# Patient Record
Sex: Female | Born: 1985 | Race: Black or African American | Hispanic: No | Marital: Single | Smoking: Never smoker
Health system: Southern US, Community
[De-identification: ages and names within clinical notes are randomized; demographics above are authoritative.]

## PROBLEM LIST (undated history)

## (undated) DIAGNOSIS — Z789 Other specified health status: Secondary | ICD-10-CM

## (undated) HISTORY — PX: NO PAST SURGERIES: SHX2092

---
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
## 2007-07-14 ENCOUNTER — Inpatient Hospital Stay (HOSPITAL_COMMUNITY): Admission: AD | Admit: 2007-07-14 | Discharge: 2007-07-15 | Payer: Self-pay | Admitting: Family Medicine

## 2007-07-20 ENCOUNTER — Inpatient Hospital Stay (HOSPITAL_COMMUNITY): Admission: AD | Admit: 2007-07-20 | Discharge: 2007-07-20 | Payer: Self-pay | Admitting: Obstetrics and Gynecology

## 2007-09-04 ENCOUNTER — Ambulatory Visit (HOSPITAL_COMMUNITY): Admission: RE | Admit: 2007-09-04 | Discharge: 2007-09-04 | Payer: Self-pay | Admitting: Family Medicine

## 2007-09-13 ENCOUNTER — Inpatient Hospital Stay (HOSPITAL_COMMUNITY): Admission: AD | Admit: 2007-09-13 | Discharge: 2007-09-13 | Payer: Self-pay | Admitting: Obstetrics & Gynecology

## 2007-09-26 ENCOUNTER — Ambulatory Visit: Payer: Self-pay

## 2007-09-26 ENCOUNTER — Ambulatory Visit: Payer: Self-pay | Admitting: Cardiology

## 2007-09-26 ENCOUNTER — Ambulatory Visit (HOSPITAL_COMMUNITY): Admission: RE | Admit: 2007-09-26 | Discharge: 2007-09-26 | Payer: Self-pay | Admitting: Family Medicine

## 2007-10-04 ENCOUNTER — Ambulatory Visit (HOSPITAL_COMMUNITY): Admission: RE | Admit: 2007-10-04 | Discharge: 2007-10-04 | Payer: Self-pay | Admitting: Family Medicine

## 2007-10-16 ENCOUNTER — Ambulatory Visit: Payer: Self-pay | Admitting: Obstetrics & Gynecology

## 2007-10-16 ENCOUNTER — Inpatient Hospital Stay (HOSPITAL_COMMUNITY): Admission: AD | Admit: 2007-10-16 | Discharge: 2007-10-16 | Payer: Self-pay | Admitting: Gynecology

## 2007-11-20 ENCOUNTER — Ambulatory Visit (HOSPITAL_COMMUNITY): Admission: RE | Admit: 2007-11-20 | Discharge: 2007-11-20 | Payer: Self-pay | Admitting: Obstetrics & Gynecology

## 2008-02-13 ENCOUNTER — Inpatient Hospital Stay (HOSPITAL_COMMUNITY): Admission: AD | Admit: 2008-02-13 | Discharge: 2008-02-13 | Payer: Self-pay | Admitting: Obstetrics & Gynecology

## 2008-03-01 ENCOUNTER — Inpatient Hospital Stay (HOSPITAL_COMMUNITY): Admission: AD | Admit: 2008-03-01 | Discharge: 2008-03-01 | Payer: Self-pay | Admitting: Obstetrics & Gynecology

## 2008-03-01 ENCOUNTER — Ambulatory Visit: Payer: Self-pay | Admitting: Family

## 2008-03-04 ENCOUNTER — Ambulatory Visit: Payer: Self-pay | Admitting: Obstetrics & Gynecology

## 2008-03-04 ENCOUNTER — Observation Stay (HOSPITAL_COMMUNITY): Admission: AD | Admit: 2008-03-04 | Discharge: 2008-03-04 | Payer: Self-pay | Admitting: Obstetrics & Gynecology

## 2008-03-05 ENCOUNTER — Inpatient Hospital Stay (HOSPITAL_COMMUNITY): Admission: AD | Admit: 2008-03-05 | Discharge: 2008-03-07 | Payer: Self-pay | Admitting: Obstetrics & Gynecology

## 2008-03-05 ENCOUNTER — Ambulatory Visit: Payer: Self-pay | Admitting: Family Medicine

## 2008-03-20 ENCOUNTER — Ambulatory Visit: Payer: Self-pay | Admitting: Obstetrics & Gynecology

## 2009-09-15 ENCOUNTER — Emergency Department (HOSPITAL_COMMUNITY): Admission: EM | Admit: 2009-09-15 | Discharge: 2009-09-15 | Payer: Self-pay | Admitting: Emergency Medicine

## 2009-09-16 ENCOUNTER — Emergency Department (HOSPITAL_COMMUNITY): Admission: EM | Admit: 2009-09-16 | Discharge: 2009-09-17 | Payer: Self-pay | Admitting: Emergency Medicine

## 2010-08-18 NOTE — Assessment & Plan Note (Signed)
[facility]                            CARDIOLOGY OFFICE NOTE   SAHIRAH, RUDELL                        MRN:          [id number]  DATE:09/26/2007                            DOB:          1985/09/22    The patient is a 25 year old female with no prior cardiac history who I  am asked to evaluate for her syncope.  The patient is [redacted] weeks pregnant.  She denies any dyspnea on exertion, orthopnea, PND, pedal edema,  palpitations or exertional chest pain.  Since Memorial Day, she has had  4 separate episodes of near-syncope.  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  She suddenly became hot and diaphoretic.  There was  no nausea, palpitations, or shortness of breath.  There was no loss of  strength or sensation in her extremities nor was there any incontinence  or tongue biting.  She sat down and felt better.  She then stood back up  and felt lightheaded and everything went black, but she does not  remember losing frank consciousness.  She was dizzy for approximately 5  minutes afterwards.  She has had 3 episodes since and  none in the past  2 weeks.  All of these were described similarly.  Note, they are not  related to exertion.  Because of the above, we were asked to further  evaluate.   MEDICATION:  Her medications at present include a prenatal vitamin.   ALLERGY:  She has an allergy to [facility], but no medications.   SOCIAL HISTORY:  She does not smoke nor does she consume alcohol.   FAMILY HISTORY:  Negative for coronary disease or sudden death.   PAST MEDICAL HISTORY.:  There is no diabetes mellitus, hypertension,  hyperlipidemia.  There is no previous surgeries.  She is [redacted] weeks  pregnant.   REVIEW OF SYSTEMS:  She denies any headaches, fevers, or chills.  There  is no crepitus, cough, or hemoptysis.  There is no dysphagia,  odynophagia, melena, or hematochezia.   There is no dysuria or hematuria.  There is no rashes.  No seizure activity.  There is no orthopnea, PND or  pedal edema.  Remaining systems are negative.   PHYSICAL EXAMINATION:  VITALS:  Blood pressure of 107/68 and pulse is  76.  She weighs 128 pounds.  GENERAL:  She is well-developed, well-nourished, in no acute distress.  SKIN:  Warm and dry.  She does appear to be depressed.  There is no  peripheral clubbing.  BACK:  Normal.  HEENT:  Normal with normal eyelids.  NECK:  Supple with a normal upstroke bilaterally.  No bruits.  There is  no jugular distention and I cannot appreciate thyromegaly.  CHEST:  Clear to auscultation and percussion.  CARDIOVASCULAR:  Regular rhythm.  Normal S1 and S2 and no murmurs, rubs,  or gallops noted.  ABDOMEN:  Nontender and distended.  Positive bowel sounds.  No  hepatosplenomegaly.  There is no bruit noted.  There is an intrauterine  pregnancy noted.  She has 2+ femoral pulses bilaterally.  No bruits.  EXTREMITIES:  Show no edema.  I can palpate no cords.  She has 2+  posterior tibial pulses bilaterally.  NEUROLOGIC:  Grossly intact.   Electrocardiogram shows a sinus rhythm at a rate of 82.  The axis is  normal.  There are no ST changes.  The intervals are normal.   DIAGNOSES:  1. Syncope - the etiology of this is unclear, but there may be a      component of orthostasis, as her mother states that she is not      eating well or consuming fluid.  She did increase this recently and      apparently has felt somewhat better.  There may also be a vagal      component.  Note, her electrocardiogram is normal.  I will schedule      her to have an echocardiogram to quantify her left ventricular      function and a CardioNet monitor.  I will see back in 4-6 weeks and      we will review that information.  2. Sixteen weeks intrauterine pregnancy - management per OB/GYN.      Madolyn Frieze Jens Som, MD, [facility]  Electronically Signed     Madolyn Frieze.  Jens Som, MD, [facility]  Electronically Signed   BSC/MedQ  DD: 09/26/2007  DT: 09/26/2007  Job #: [id number]   cc:   [facility] of [facility]

## 2010-12-29 LAB — WET PREP, GENITAL
Clue Cells Wet Prep HPF POC: NONE SEEN
Trich, Wet Prep: NONE SEEN
Yeast Wet Prep HPF POC: NONE SEEN

## 2010-12-29 LAB — CBC
HCT: 33.4 — ABNORMAL LOW
Hemoglobin: 10.6 — ABNORMAL LOW
Hemoglobin: 11.3 — ABNORMAL LOW
MCHC: 33.5
MCHC: 33.8
MCV: 80.5
Platelets: 273
RBC: 4.15
RDW: 14.4
RDW: 14.2
WBC: 5.7

## 2010-12-29 LAB — URINALYSIS, ROUTINE W REFLEX MICROSCOPIC
Bilirubin Urine: NEGATIVE
Bilirubin Urine: NEGATIVE
Glucose, UA: NEGATIVE
Glucose, UA: NEGATIVE
Hgb urine dipstick: NEGATIVE
Ketones, ur: NEGATIVE
Ketones, ur: NEGATIVE
Nitrite: NEGATIVE
Nitrite: NEGATIVE
Protein, ur: NEGATIVE
Specific Gravity, Urine: 1.015
Urobilinogen, UA: 1
pH: 7
pH: 6.5

## 2010-12-29 LAB — GC/CHLAMYDIA PROBE AMP, GENITAL
Chlamydia, DNA Probe: POSITIVE — AB
GC Probe Amp, Genital: NEGATIVE

## 2010-12-29 LAB — BASIC METABOLIC PANEL
BUN: 5 — ABNORMAL LOW
Chloride: 104
Glucose, Bld: 84
Potassium: 3.6

## 2010-12-29 LAB — ABO/RH: ABO/RH(D): A POS

## 2010-12-31 LAB — GC/CHLAMYDIA PROBE AMP, GENITAL
Chlamydia, DNA Probe: POSITIVE — AB
GC Probe Amp, Genital: NEGATIVE

## 2010-12-31 LAB — CBC
HCT: 32.7 — ABNORMAL LOW
MCHC: 33.7
MCV: 83.2
Platelets: 261
WBC: 6.4

## 2010-12-31 LAB — URINALYSIS, ROUTINE W REFLEX MICROSCOPIC
Bilirubin Urine: NEGATIVE
Ketones, ur: NEGATIVE
Nitrite: NEGATIVE
Protein, ur: NEGATIVE
pH: 8.5 — ABNORMAL HIGH

## 2010-12-31 LAB — BASIC METABOLIC PANEL
BUN: 5 — ABNORMAL LOW
CO2: 27
Chloride: 102
Glucose, Bld: 88
Potassium: 4

## 2010-12-31 LAB — URINE MICROSCOPIC-ADD ON

## 2011-01-05 LAB — URINALYSIS, ROUTINE W REFLEX MICROSCOPIC
Bilirubin Urine: NEGATIVE
Nitrite: NEGATIVE
Specific Gravity, Urine: 1.01
pH: 6.5

## 2011-01-05 LAB — URINE MICROSCOPIC-ADD ON

## 2011-01-08 LAB — RPR: RPR Ser Ql: NONREACTIVE

## 2011-01-08 LAB — CBC
MCHC: 32.9 g/dL (ref 30.0–36.0)
RDW: 15.1 % (ref 11.5–15.5)

## 2012-06-20 ENCOUNTER — Emergency Department (HOSPITAL_COMMUNITY): Payer: 59

## 2012-06-20 ENCOUNTER — Emergency Department (HOSPITAL_COMMUNITY)
Admission: EM | Admit: 2012-06-20 | Discharge: 2012-06-20 | Disposition: A | Discharge status: Home / Self Care | Payer: 59 | Attending: Emergency Medicine | Admitting: Emergency Medicine

## 2012-06-20 ENCOUNTER — Encounter (HOSPITAL_COMMUNITY): Payer: Self-pay | Admitting: *Deleted

## 2012-06-20 DIAGNOSIS — S139XXA Sprain of joints and ligaments of unspecified parts of neck, initial encounter: Secondary | ICD-10-CM | POA: Insufficient documentation

## 2012-06-20 DIAGNOSIS — Y92410 Unspecified street and highway as the place of occurrence of the external cause: Secondary | ICD-10-CM | POA: Insufficient documentation

## 2012-06-20 DIAGNOSIS — Y9389 Activity, other specified: Secondary | ICD-10-CM | POA: Insufficient documentation

## 2012-06-20 DIAGNOSIS — S7012XA Contusion of left thigh, initial encounter: Secondary | ICD-10-CM

## 2012-06-20 DIAGNOSIS — S60229A Contusion of unspecified hand, initial encounter: Secondary | ICD-10-CM | POA: Insufficient documentation

## 2012-06-20 DIAGNOSIS — S60222A Contusion of left hand, initial encounter: Secondary | ICD-10-CM

## 2012-06-20 DIAGNOSIS — S7010XA Contusion of unspecified thigh, initial encounter: Secondary | ICD-10-CM | POA: Insufficient documentation

## 2012-06-20 DIAGNOSIS — S161XXA Strain of muscle, fascia and tendon at neck level, initial encounter: Secondary | ICD-10-CM

## 2012-06-20 MED ORDER — CYCLOBENZAPRINE HCL 10 MG PO TABS: 10.0000 mg | ORAL_TABLET | Freq: Two times a day (BID) | ORAL | Status: DC | PRN

## 2012-06-20 MED ORDER — IBUPROFEN 400 MG PO TABS: 400.0000 mg | ORAL_TABLET | Freq: Four times a day (QID) | ORAL | Status: DC | PRN
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
## 2012-06-20 NOTE — Progress Notes (Signed)
Pt confirms pcp is Optician, dispensing (OB GYN) EPIC updated
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
## 2012-06-20 NOTE — ED Notes (Signed)
Pt ambulatory with steady gait to exam room.

## 2012-06-24 NOTE — ED Provider Notes (Signed)
  Medical screening examination/treatment/procedure(s) were performed by non-physician practitioner and as supervising physician I was immediately available for consultation/collaboration.    Bev Drennen, MD 06/24/12 1546 

## 2012-06-27 ENCOUNTER — Encounter (HOSPITAL_COMMUNITY): Payer: Self-pay | Admitting: Emergency Medicine

## 2012-06-27 ENCOUNTER — Emergency Department (HOSPITAL_COMMUNITY)
Admission: EM | Admit: 2012-06-27 | Discharge: 2012-06-27 | Disposition: A | Discharge status: Home / Self Care | Payer: 59 | Attending: Emergency Medicine | Admitting: Emergency Medicine

## 2012-06-27 DIAGNOSIS — S8010XA Contusion of unspecified lower leg, initial encounter: Secondary | ICD-10-CM | POA: Insufficient documentation

## 2012-06-27 DIAGNOSIS — Y92410 Unspecified street and highway as the place of occurrence of the external cause: Secondary | ICD-10-CM | POA: Insufficient documentation

## 2012-06-27 DIAGNOSIS — Y9389 Activity, other specified: Secondary | ICD-10-CM | POA: Insufficient documentation

## 2012-06-27 DIAGNOSIS — S8012XA Contusion of left lower leg, initial encounter: Secondary | ICD-10-CM

## 2012-06-27 MED ORDER — NAPROXEN 500 MG PO TABS: 500.0000 mg | ORAL_TABLET | Freq: Two times a day (BID) | ORAL | Status: DC
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
## 2012-06-27 NOTE — ED Provider Notes (Signed)
Medical screening examination/treatment/procedure(s) were performed by non-physician practitioner and as supervising physician I was immediately available for consultation/collaboration.    Aws Shere R Analiah Drum, MD 06/27/12 2331 
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
## 2016-11-20 ENCOUNTER — Inpatient Hospital Stay (HOSPITAL_COMMUNITY)
Admission: EM | Admit: 2016-11-20 | Discharge: 2016-11-21 | DRG: 872 | Disposition: A | Discharge status: Home / Self Care | Payer: Self-pay | Attending: Internal Medicine | Admitting: Internal Medicine

## 2016-11-20 ENCOUNTER — Encounter (HOSPITAL_COMMUNITY): Payer: Self-pay | Admitting: Nurse Practitioner

## 2016-11-20 DIAGNOSIS — E876 Hypokalemia: Secondary | ICD-10-CM | POA: Diagnosis present

## 2016-11-20 DIAGNOSIS — R197 Diarrhea, unspecified: Secondary | ICD-10-CM | POA: Diagnosis present

## 2016-11-20 DIAGNOSIS — A419 Sepsis, unspecified organism: Principal | ICD-10-CM | POA: Diagnosis present

## 2016-11-20 DIAGNOSIS — Z91013 Allergy to seafood: Secondary | ICD-10-CM

## 2016-11-20 DIAGNOSIS — K5100 Ulcerative (chronic) pancolitis without complications: Secondary | ICD-10-CM | POA: Diagnosis present

## 2016-11-20 HISTORY — DX: Other specified health status: Z78.9

## 2016-11-20 LAB — COMPREHENSIVE METABOLIC PANEL
ALBUMIN: 4.1 g/dL (ref 3.5–5.0)
ALT: 23 U/L (ref 14–54)
AST: 26 U/L (ref 15–41)
Alkaline Phosphatase: 60 U/L (ref 38–126)
Anion gap: 9 (ref 5–15)
BILIRUBIN TOTAL: 0.5 mg/dL (ref 0.3–1.2)
BUN: 6 mg/dL (ref 6–20)
CO2: 24 mmol/L (ref 22–32)
CREATININE: 0.74 mg/dL (ref 0.44–1.00)
Calcium: 8.7 mg/dL — ABNORMAL LOW (ref 8.9–10.3)
Chloride: 104 mmol/L (ref 101–111)
GFR calc Af Amer: >60 mL/min (ref >60)
GLUCOSE: 107 mg/dL — AB (ref 65–99)
Potassium: 3 mmol/L — ABNORMAL LOW (ref 3.5–5.1)
Sodium: 137 mmol/L (ref 135–145)
TOTAL PROTEIN: 7.3 g/dL (ref 6.5–8.1)

## 2016-11-20 LAB — URINALYSIS, ROUTINE W REFLEX MICROSCOPIC
BACTERIA UA: NONE SEEN
Bilirubin Urine: NEGATIVE
Glucose, UA: NEGATIVE mg/dL
Ketones, ur: 5 mg/dL — AB
NITRITE: NEGATIVE
PH: 6 (ref 5.0–8.0)
Protein, ur: 30 mg/dL — AB
SPECIFIC GRAVITY, URINE: 1.019 (ref 1.005–1.030)

## 2016-11-20 LAB — LIPASE, BLOOD: Lipase: 19 U/L (ref 11–51)

## 2016-11-20 LAB — I-STAT BETA HCG BLOOD, ED (MC, WL, AP ONLY): I-stat hCG, quantitative: <5 m[IU]/mL (ref <5)

## 2016-11-20 LAB — CBC
HCT: 40.5 % (ref 36.0–46.0)
Hemoglobin: 13.2 g/dL (ref 12.0–15.0)
MCH: 27.4 pg (ref 26.0–34.0)
MCHC: 32.6 g/dL (ref 30.0–36.0)
MCV: 84.2 fL (ref 78.0–100.0)
Platelets: 234 K/uL (ref 150–400)
RBC: 4.81 MIL/uL (ref 3.87–5.11)
RDW: 13.7 % (ref 11.5–15.5)
WBC: 11.5 K/uL — ABNORMAL HIGH (ref 4.0–10.5)

## 2016-11-20 MED ORDER — SODIUM CHLORIDE 0.9 % IV BOLUS (SEPSIS)
1000.0000 mL | Freq: Once | INTRAVENOUS | Status: AC
  Administered 2016-11-21: 1000 mL via INTRAVENOUS

## 2016-11-20 MED ORDER — ONDANSETRON HCL 4 MG/2ML IJ SOLN
4.0000 mg | Freq: Once | INTRAMUSCULAR | Status: AC
  Administered 2016-11-21: 4 mg via INTRAVENOUS
  Filled 2016-11-20: qty 2

## 2016-11-20 MED ORDER — FENTANYL CITRATE (PF) 100 MCG/2ML IJ SOLN
100.0000 ug | Freq: Once | INTRAMUSCULAR | Status: AC
  Administered 2016-11-21: 100 ug via INTRAVENOUS
  Filled 2016-11-20: qty 2

## 2016-11-20 NOTE — ED Provider Notes (Signed)
[facility]-EMERGENCY DEPT Provider Note: Nicole Dell, MD, FACEP  CSN: [id number] MRN: [id number] ARRIVAL: 11/20/16 at 1942 ROOM: WA15/WA15   CHIEF COMPLAINT  Abdominal Pain   HISTORY OF PRESENT ILLNESS  11/20/16 10:57 PM Nicole Lamb is a 31 y.o. female with a 24-hour history of diarrhea which is described as bloody. This is been accompanied by abdominal pain which is described as generalized and cramping. She rates her pain as a 10 out of 10. She has had a fever with this. She has had no nausea or vomiting. She estimates she has moved her bowels 20 times since this began. She took Tylenol yesterday evening but nothing since.   History reviewed. No pertinent past medical history.  History reviewed. No pertinent surgical history.  History reviewed. No pertinent family history.  Social History  Substance Use Topics  . Smoking status: Never Smoker  . Smokeless tobacco: Never Used  . Alcohol use Yes     Comment: 1-2 per week    Prior to Admission medications   Medication Sig Start Date End Date Taking? Authorizing Provider  cyclobenzaprine (FLEXERIL) 10 MG tablet Take 1 tablet (10 mg total) by mouth 2 (two) times daily as needed for muscle spasms. 06/20/12   Antony Madura, PA-C  medroxyPROGESTERone (DEPO-PROVERA) 150 MG/ML injection Inject 150 mg into the muscle every 3 (three) months.    [provider]  naproxen (NAPROSYN) 500 MG tablet Take 1 tablet (500 mg total) by mouth 2 (two) times daily with a meal. 06/27/12   Muthersbaugh, Dahlia Client, PA-C    Allergies Shellfish allergy   REVIEW OF SYSTEMS  Negative except as noted here or in the History of Present Illness.   PHYSICAL EXAMINATION  Initial Vital Signs Blood pressure 112/75, pulse (!) 111, temperature (!) 101.1 F (38.4 C), temperature source Oral, resp. rate 20, SpO2 99 %.  Examination General: Well-developed, well-nourished female in no acute distress; appearance consistent with age of record HENT:  normocephalic; atraumatic Eyes: pupils equal, round and reactive to light; extraocular muscles intact Neck: supple Heart: regular rate and rhythm Lungs: clear to auscultation bilaterally Abdomen: soft; nondistended; diffusely tender; no masses or hepatosplenomegaly; bowel sounds hypoactive Extremities: No deformity; full range of motion; pulses normal Neurologic: Awake, alert and oriented; motor function intact in all extremities and symmetric; no facial droop Skin: Warm and dry Psychiatric: Normal mood and affect   RESULTS  Summary of this visit's results, reviewed by myself:   EKG Interpretation  Date/Time:    Ventricular Rate:    PR Interval:    QRS Duration:   QT Interval:    QTC Calculation:   R Axis:     Text Interpretation:        Laboratory Studies: Results for orders placed or performed during the hospital encounter of 11/20/16 (from the past 24 hour(s))  Urinalysis, Routine w reflex microscopic     Status: Abnormal   Collection Time: 11/20/16  9:03 PM  Result Value Ref Range   Color, Urine YELLOW YELLOW   APPearance HAZY (A) CLEAR   Specific Gravity, Urine 1.019 1.005 - 1.030   pH 6.0 5.0 - 8.0   Glucose, UA NEGATIVE NEGATIVE mg/dL   Hgb urine dipstick MODERATE (A) NEGATIVE   Bilirubin Urine NEGATIVE NEGATIVE   Ketones, ur 5 (A) NEGATIVE mg/dL   Protein, ur 30 (A) NEGATIVE mg/dL   Nitrite NEGATIVE NEGATIVE   Leukocytes, UA TRACE (A) NEGATIVE   RBC / HPF 0-5 0 - 5 RBC/hpf  WBC, UA 6-30 0 - 5 WBC/hpf   Bacteria, UA NONE SEEN NONE SEEN   Squamous Epithelial / LPF 6-30 (A) NONE SEEN   Mucous PRESENT   Lipase, blood     Status: None   Collection Time: 11/20/16 11:13 PM  Result Value Ref Range   Lipase 19 11 - 51 U/L  Comprehensive metabolic panel     Status: Abnormal   Collection Time: 11/20/16 11:13 PM  Result Value Ref Range   Sodium 137 135 - 145 mmol/L   Potassium 3.0 (L) 3.5 - 5.1 mmol/L   Chloride 104 101 - 111 mmol/L   CO2 24 22 - 32 mmol/L    Glucose, Bld 107 (H) 65 - 99 mg/dL   BUN 6 6 - 20 mg/dL   Creatinine, Ser 1.61 0.44 - 1.00 mg/dL   Calcium 8.7 (L) 8.9 - 10.3 mg/dL   Total Protein 7.3 6.5 - 8.1 g/dL   Albumin 4.1 3.5 - 5.0 g/dL   AST 26 15 - 41 U/L   ALT 23 14 - 54 U/L   Alkaline Phosphatase 60 38 - 126 U/L   Total Bilirubin 0.5 0.3 - 1.2 mg/dL   GFR calc non Af Amer >60 >60 mL/min   GFR calc Af Amer >60 >60 mL/min   Anion gap 9 5 - 15  CBC     Status: Abnormal   Collection Time: 11/20/16 11:13 PM  Result Value Ref Range   WBC 11.5 (H) 4.0 - 10.5 K/uL   RBC 4.81 3.87 - 5.11 MIL/uL   Hemoglobin 13.2 12.0 - 15.0 g/dL   HCT 09.6 04.5 - 40.9 %   MCV 84.2 78.0 - 100.0 fL   MCH 27.4 26.0 - 34.0 pg   MCHC 32.6 30.0 - 36.0 g/dL   RDW 81.1 91.4 - 78.2 %   Platelets 234 150 - 400 K/uL  I-Stat Beta hCG blood, ED ([facility], [facility], [facility] only)     Status: None   Collection Time: 11/20/16 11:21 PM  Result Value Ref Range   I-stat hCG, quantitative <5.0 <5 mIU/mL   Comment 3           Imaging Studies: Ct Abdomen Pelvis W Contrast  Result Date: 11/21/2016 CLINICAL DATA:  Diffuse abdominal pain, diarrhea fever and chills EXAM: CT ABDOMEN AND PELVIS WITH CONTRAST TECHNIQUE: Multidetector CT imaging of the abdomen and pelvis was performed using the standard protocol following bolus administration of intravenous contrast. CONTRAST:  ISOVUE-300 IOPAMIDOL (ISOVUE-300) INJECTION 61% COMPARISON:  None. FINDINGS: Lower chest: No acute abnormality. Hepatobiliary: No focal liver abnormality is seen. No gallstones, gallbladder wall thickening, or biliary dilatation. Pancreas: Unremarkable. No pancreatic ductal dilatation or surrounding inflammatory changes. Spleen: Normal in size without focal abnormality. Adrenals/Urinary Tract: Adrenal glands are unremarkable. Kidneys are normal, without renal calculi, focal lesion, or hydronephrosis. Bladder is unremarkable. Stomach/Bowel: Stomach is within normal limits. No dilated small bowel. Diffuse wall  thickening of the colon with thickening at the ileocecal junction. Appendix within normal limits. Vascular/Lymphatic: No significant vascular findings are present. No enlarged abdominal or pelvic lymph nodes. Reproductive: Uterus and bilateral adnexa are unremarkable. Other: Small free fluid in the pelvis.  Negative for free air. Musculoskeletal: No acute or significant osseous findings. IMPRESSION: 1. Diffuse colon wall thickening and thickening at the ileocecal valve suspicious for colitis which may be secondary to infection or inflammatory bowel disease. 2. Small amount of free fluid in the pelvis Electronically Signed   By: Adrian Prows.D.  On: 11/21/2016 00:56    ED COURSE  Nursing notes and initial vitals signs, including pulse oximetry, reviewed.  Vitals:   11/20/16 1950 11/21/16 0018  BP: 112/75 112/79  Pulse: (!) 111 85  Resp: 20 20  Temp: (!) 101.1 F (38.4 C) 99.7 F (37.6 C)  TempSrc: Oral Oral  SpO2: 99% 98%    PROCEDURES    ED DIAGNOSES     ICD-10-CM   1. Pancolitis (HCC) K51.00        Warrick Llera, MD 11/21/16 [phone]

## 2016-11-20 NOTE — ED Notes (Signed)
I made one attempt to collect labs and was unsuccessful.

## 2016-11-20 NOTE — ED Triage Notes (Signed)
Pt c/o diffuse abdominall pain that she is describing as cramping aldo states she has had diarrhea, fever and chills since yesterday. Denies N/V.

## 2016-11-21 ENCOUNTER — Encounter (HOSPITAL_COMMUNITY): Payer: Self-pay | Admitting: Radiology

## 2016-11-21 ENCOUNTER — Emergency Department (HOSPITAL_COMMUNITY): Payer: Self-pay

## 2016-11-21 DIAGNOSIS — K5100 Ulcerative (chronic) pancolitis without complications: Secondary | ICD-10-CM

## 2016-11-21 DIAGNOSIS — A419 Sepsis, unspecified organism: Secondary | ICD-10-CM | POA: Diagnosis present

## 2016-11-21 DIAGNOSIS — E876 Hypokalemia: Secondary | ICD-10-CM | POA: Diagnosis present

## 2016-11-21 DIAGNOSIS — R197 Diarrhea, unspecified: Secondary | ICD-10-CM | POA: Diagnosis present

## 2016-11-21 LAB — CBC WITH DIFFERENTIAL/PLATELET
BASOS ABS: 0 10*3/uL (ref 0.0–0.1)
BASOS PCT: 0 %
EOS PCT: 1 %
Eosinophils Absolute: 0.1 10*3/uL (ref 0.0–0.7)
HCT: 35.5 % — ABNORMAL LOW (ref 36.0–46.0)
Hemoglobin: 11.4 g/dL — ABNORMAL LOW (ref 12.0–15.0)
Lymphocytes Relative: 13 %
Lymphs Abs: 1.2 10*3/uL (ref 0.7–4.0)
MCH: 27 pg (ref 26.0–34.0)
MCHC: 32.1 g/dL (ref 30.0–36.0)
MCV: 84.1 fL (ref 78.0–100.0)
MONO ABS: 0.8 10*3/uL (ref 0.1–1.0)
Monocytes Relative: 10 %
Neutro Abs: 6.6 10*3/uL (ref 1.7–7.7)
Neutrophils Relative %: 76 %
PLATELETS: 221 10*3/uL (ref 150–400)
RBC: 4.22 MIL/uL (ref 3.87–5.11)
RDW: 13.9 % (ref 11.5–15.5)
WBC: 8.7 10*3/uL (ref 4.0–10.5)

## 2016-11-21 LAB — BASIC METABOLIC PANEL
Anion gap: 6 (ref 5–15)
BUN: 5 mg/dL — ABNORMAL LOW (ref 6–20)
CALCIUM: 8.2 mg/dL — AB (ref 8.9–10.3)
CO2: 26 mmol/L (ref 22–32)
CREATININE: 0.68 mg/dL (ref 0.44–1.00)
Chloride: 106 mmol/L (ref 101–111)
Glucose, Bld: 137 mg/dL — ABNORMAL HIGH (ref 65–99)
Potassium: 3.2 mmol/L — ABNORMAL LOW (ref 3.5–5.1)
Sodium: 138 mmol/L (ref 135–145)

## 2016-11-21 LAB — SEDIMENTATION RATE: SED RATE: 15 mm/h (ref 0–22)

## 2016-11-21 LAB — C-REACTIVE PROTEIN: CRP: 13.6 mg/dL — AB (ref <1.0)

## 2016-11-21 LAB — HIV ANTIBODY (ROUTINE TESTING W REFLEX): HIV Screen 4th Generation wRfx: NONREACTIVE

## 2016-11-21 MED ORDER — ONDANSETRON HCL 4 MG PO TABS: 4.0000 mg | ORAL_TABLET | Freq: Four times a day (QID) | ORAL | Status: DC | PRN

## 2016-11-21 MED ORDER — CIPROFLOXACIN IN D5W 400 MG/200ML IV SOLN
400.0000 mg | Freq: Two times a day (BID) | INTRAVENOUS | Status: DC
  Administered 2016-11-21: 400 mg via INTRAVENOUS
  Filled 2016-11-21: qty 200

## 2016-11-21 MED ORDER — CIPROFLOXACIN IN D5W 400 MG/200ML IV SOLN
400.0000 mg | Freq: Once | INTRAVENOUS | Status: AC
  Administered 2016-11-21: 400 mg via INTRAVENOUS
  Filled 2016-11-21: qty 200

## 2016-11-21 MED ORDER — POTASSIUM CHLORIDE CRYS ER 20 MEQ PO TBCR
20.0000 meq | EXTENDED_RELEASE_TABLET | Freq: Once | ORAL | Status: AC
  Administered 2016-11-21: 20 meq via ORAL
  Filled 2016-11-21: qty 1

## 2016-11-21 MED ORDER — METRONIDAZOLE IN NACL 5-0.79 MG/ML-% IV SOLN
500.0000 mg | Freq: Once | INTRAVENOUS | Status: AC
  Administered 2016-11-21: 500 mg via INTRAVENOUS
  Filled 2016-11-21: qty 100

## 2016-11-21 MED ORDER — POTASSIUM CHLORIDE CRYS ER 20 MEQ PO TBCR
40.0000 meq | EXTENDED_RELEASE_TABLET | Freq: Two times a day (BID) | ORAL | Status: DC
  Administered 2016-11-21: 40 meq via ORAL
  Filled 2016-11-21: qty 2

## 2016-11-21 MED ORDER — METRONIDAZOLE 500 MG PO TABS: 500.0000 mg | ORAL_TABLET | Freq: Three times a day (TID) | ORAL | 0 refills | Status: AC

## 2016-11-21 MED ORDER — ACETAMINOPHEN 325 MG PO TABS: 650.0000 mg | ORAL_TABLET | Freq: Four times a day (QID) | ORAL | Status: DC | PRN

## 2016-11-21 MED ORDER — ACETAMINOPHEN 650 MG RE SUPP: 650.0000 mg | Freq: Four times a day (QID) | RECTAL | Status: DC | PRN

## 2016-11-21 MED ORDER — IOPAMIDOL (ISOVUE-300) INJECTION 61%
100.0000 mL | Freq: Once | INTRAVENOUS | Status: AC | PRN
  Administered 2016-11-21: 100 mL via INTRAVENOUS

## 2016-11-21 MED ORDER — IOPAMIDOL (ISOVUE-300) INJECTION 61%
INTRAVENOUS | Status: AC
  Filled 2016-11-21: qty 100

## 2016-11-21 MED ORDER — POTASSIUM CHLORIDE IN NACL 20-0.9 MEQ/L-% IV SOLN
INTRAVENOUS | Status: AC
  Administered 2016-11-21 (×2): via INTRAVENOUS
  Filled 2016-11-21 (×2): qty 1000

## 2016-11-21 MED ORDER — ONDANSETRON HCL 4 MG/2ML IJ SOLN: 4.0000 mg | Freq: Four times a day (QID) | INTRAMUSCULAR | Status: DC | PRN

## 2016-11-21 MED ORDER — MAGNESIUM SULFATE IN D5W 1-5 GM/100ML-% IV SOLN
1.0000 g | Freq: Once | INTRAVENOUS | Status: AC
  Administered 2016-11-21: 1 g via INTRAVENOUS
  Filled 2016-11-21: qty 100

## 2016-11-21 MED ORDER — FENTANYL CITRATE (PF) 100 MCG/2ML IJ SOLN: 50.0000 ug | INTRAMUSCULAR | Status: DC | PRN

## 2016-11-21 MED ORDER — METRONIDAZOLE IN NACL 5-0.79 MG/ML-% IV SOLN
500.0000 mg | Freq: Three times a day (TID) | INTRAVENOUS | Status: DC
  Administered 2016-11-21 (×2): 500 mg via INTRAVENOUS
  Filled 2016-11-21 (×2): qty 100

## 2016-11-21 MED ORDER — HYDROCODONE-ACETAMINOPHEN 5-325 MG PO TABS
1.0000 | ORAL_TABLET | ORAL | Status: DC | PRN
  Administered 2016-11-21: 2 via ORAL
  Filled 2016-11-21: qty 2

## 2016-11-21 MED ORDER — CIPROFLOXACIN HCL 500 MG PO TABS: 500.0000 mg | ORAL_TABLET | Freq: Two times a day (BID) | ORAL | 0 refills | Status: AC

## 2016-11-21 NOTE — Progress Notes (Signed)
Pt leaving this afternoon with her fiance. Alert, oriented, and without c/o. Discharge instructions/prescriptions given/explained with pt verbalizing understanding. Pt aware of followup.

## 2016-11-21 NOTE — Discharge Summary (Signed)
Physician Discharge Summary  Nicole Lamb FSF:[id number] DOB: 1985/07/02 DOA: 11/20/2016  PCP: Waynard Reeds, MD  Admit date: 11/20/2016 Discharge date: 11/21/2016  Admitted From: home Disposition:  Home  Recommendations for Outpatient Follow-up:  1. Follow up with PCP in 1-2 weeks   Home Health:No Equipment/Devices:none  Discharge Condition:stable CODE STATUS:full Diet recommendation: Regular   Brief/Interim Summary: 31 y.o. female with no past medical history presents to the emergency department for fever and chills accompanied by bloody diarrhea and abdominal pain to started 2 days prior to admission. In the ED she was tachycardic and febrile with a potassium of 3.0 leukocytosis of 11.5 CT scan of the abdomen showed diffuse colonic wall thickening  Discharge Diagnoses:  Principal Problem:   Pancolitis (HCC) Active Problems:   Bloody diarrhea   Hypokalemia   Sepsis (HCC)  Sepsis due to pancolitis: Ct scan of the abdomen and pelvis as below, she was fluid resuscitated. Started on IV ciprofloxacin and Flagyl. She has remained afebrile, leukocytosis is resolved By the next day her abdominal pain was resolved, she was able to tolerate diet. She was changed to oral Cipro and Flagyl which she will continue for a total of 7 days. She had no further diarrhea in the hospital.  Hypokalemia D due to diarrhea continue to replete orally.  Discharge Instructions  Discharge Instructions    Diet - low sodium heart healthy    Complete by:  As directed    Increase activity slowly    Complete by:  As directed      Allergies as of 11/21/2016      Reactions   Shellfish Allergy Swelling      Medication List    TAKE these medications   acetaminophen 500 MG tablet Commonly known as:  TYLENOL Take 1,000 mg by mouth every 6 (six) hours as needed for mild pain, moderate pain or headache.   ciprofloxacin 500 MG tablet Commonly known as:  CIPRO Take 1 tablet (500 mg total) by mouth 2  (two) times daily.   metroNIDAZOLE 500 MG tablet Commonly known as:  FLAGYL Take 1 tablet (500 mg total) by mouth 3 (three) times daily.       Allergies  Allergen Reactions  . Shellfish Allergy Swelling    Consultations:  None   Procedures/Studies: Ct Abdomen Pelvis W Contrast  Result Date: 11/21/2016 CLINICAL DATA:  Diffuse abdominal pain, diarrhea fever and chills EXAM: CT ABDOMEN AND PELVIS WITH CONTRAST TECHNIQUE: Multidetector CT imaging of the abdomen and pelvis was performed using the standard protocol following bolus administration of intravenous contrast. CONTRAST:  ISOVUE-300 IOPAMIDOL (ISOVUE-300) INJECTION 61% COMPARISON:  None. FINDINGS: Lower chest: No acute abnormality. Hepatobiliary: No focal liver abnormality is seen. No gallstones, gallbladder wall thickening, or biliary dilatation. Pancreas: Unremarkable. No pancreatic ductal dilatation or surrounding inflammatory changes. Spleen: Normal in size without focal abnormality. Adrenals/Urinary Tract: Adrenal glands are unremarkable. Kidneys are normal, without renal calculi, focal lesion, or hydronephrosis. Bladder is unremarkable. Stomach/Bowel: Stomach is within normal limits. No dilated small bowel. Diffuse wall thickening of the colon with thickening at the ileocecal junction. Appendix within normal limits. Vascular/Lymphatic: No significant vascular findings are present. No enlarged abdominal or pelvic lymph nodes. Reproductive: Uterus and bilateral adnexa are unremarkable. Other: Small free fluid in the pelvis.  Negative for free air. Musculoskeletal: No acute or significant osseous findings. IMPRESSION: 1. Diffuse colon wall thickening and thickening at the ileocecal valve suspicious for colitis which may be secondary to infection or inflammatory bowel disease.  2. Small amount of free fluid in the pelvis Electronically Signed   By: Jasmine Pang M.D.   On: 11/21/2016 00:56       Subjective: She has no new  complaints hungry she tolerated her diet.  Discharge Exam: Vitals:   11/21/16 0030 11/21/16 0329  BP: 108/74 114/61  Pulse: 85 84  Resp:  20  Temp:  98.4 F (36.9 C)  SpO2: 96% 100%   Vitals:   11/20/16 1950 11/21/16 0018 11/21/16 0030 11/21/16 0329  BP: 112/75 112/79 108/74 114/61  Pulse: (!) 111 85 85 84  Resp: 20 20  20   Temp: (!) 101.1 F (38.4 C) 99.7 F (37.6 C)  98.4 F (36.9 C)  TempSrc: Oral Oral  Oral  SpO2: 99% 98% 96% 100%  Weight:    77.3 kg (170 lb 8 oz)  Height:    5\' 6"  (1.676 m)    General: Pt is alert, awake, not in acute distress Cardiovascular: RRR, S1/S2 +, no rubs, no gallops Respiratory: CTA bilaterally, no wheezing, no rhonchi Abdominal: Soft, NT, ND, bowel sounds + Extremities: no edema, no cyanosis    The results of significant diagnostics from this hospitalization (including imaging, microbiology, ancillary and laboratory) are listed below for reference.     Microbiology: No results found for this or any previous visit (from the past 240 hour(s)).   Labs: BNP (last 3 results) No results for input(s): BNP in the last 8760 hours. Basic Metabolic Panel:  Recent Labs Lab 11/20/16 2313 11/21/16 0631  NA 137 138  K 3.0* 3.2*  CL 104 106  CO2 24 26  GLUCOSE 107* 137*  BUN 6 5*  CREATININE 0.74 0.68  CALCIUM 8.7* 8.2*   Liver Function Tests:  Recent Labs Lab 11/20/16 2313  AST 26  ALT 23  ALKPHOS 60  BILITOT 0.5  PROT 7.3  ALBUMIN 4.1    Recent Labs Lab 11/20/16 2313  LIPASE 19   No results for input(s): AMMONIA in the last 168 hours. CBC:  Recent Labs Lab 11/20/16 2313 11/21/16 0631  WBC 11.5* 8.7  NEUTROABS  --  6.6  HGB 13.2 11.4*  HCT 40.5 35.5*  MCV 84.2 84.1  PLT 234 221   Cardiac Enzymes: No results for input(s): CKTOTAL, CKMB, CKMBINDEX, TROPONINI in the last 168 hours. BNP: Invalid input(s): POCBNP CBG: No results for input(s): GLUCAP in the last 168 hours. D-Dimer No results for input(s):  DDIMER in the last 72 hours. Hgb A1c No results for input(s): HGBA1C in the last 72 hours. Lipid Profile No results for input(s): CHOL, HDL, LDLCALC, TRIG, CHOLHDL, LDLDIRECT in the last 72 hours. Thyroid function studies No results for input(s): TSH, T4TOTAL, T3FREE, THYROIDAB in the last 72 hours.  Invalid input(s): FREET3 Anemia work up No results for input(s): VITAMINB12, FOLATE, FERRITIN, TIBC, IRON, RETICCTPCT in the last 72 hours. Urinalysis    Component Value Date/Time   COLORURINE YELLOW 11/20/2016 2103   APPEARANCEUR HAZY (A) 11/20/2016 2103   LABSPEC 1.019 11/20/2016 2103   PHURINE 6.0 11/20/2016 2103   GLUCOSEU NEGATIVE 11/20/2016 2103   HGBUR MODERATE (A) 11/20/2016 2103   BILIRUBINUR NEGATIVE 11/20/2016 2103   KETONESUR 5 (A) 11/20/2016 2103   PROTEINUR 30 (A) 11/20/2016 2103   UROBILINOGEN 0.2 03/01/2008 1613   NITRITE NEGATIVE 11/20/2016 2103   LEUKOCYTESUR TRACE (A) 11/20/2016 2103   Sepsis Labs Invalid input(s): PROCALCITONIN,  WBC,  LACTICIDVEN Microbiology No results found for this or any previous visit (from the past  240 hour(s)).   Time coordinating discharge: Over 30 minutes  SIGNED:   Marinda Elk, MD  [facility] 11/21/2016, 8:37 AM Pager   If 7PM-7AM, please contact night-coverage [email or web address] Password [email or web address]

## 2016-11-21 NOTE — H&P (Signed)
History and Physical    Nicole Lamb DGU:[id number] DOB: 1986-01-31 DOA: 11/20/2016  PCP: Vanessa Kick, MD   Patient coming from: Home  Chief Complaint: Bloody diarrhea, abd pain, fevers  HPI: Nicole Lamb is a 31 y.o. female with no known past medical history, who presented to the emergency department for evaluation of fevers, chills, cramping abdominal pain, and bloody diarrhea for the past 2 days. Patient reports that she was in her usual state of health until yesterday when she noted the insidious development of crampy abdominal discomfort and bloody diarrhea. Since that time, the symptoms have persisted and she has been febrile at home. She took Tylenol with some slight relief. She has never experienced similar symptoms previously, denies any family history of inflammatory bowel disease, denies any recent travel or antibiotic use, and denies nausea or vomiting. She denies chest pain or palpitations and denies lightheadedness.   ED Course: Upon arrival to the ED, patient is found to be febrile to 38.4 C, saturating well on room air, tachycardic, and with vitals otherwise stable. Chemistry panel is notable for a potassium of 3.0 and CBC features a leukocytosis to 11,500. CT of the abdomen and pelvis demonstrates diffuse colon wall thickening and at the ileocecal valve suspicious for colitis, likely infectious or IBD. Patient was given a liter of normal saline, 100 g of fentanyl, 4 mg IV Zofran, and ciprofloxacin with Flagyl in the ED. Tachycardia resolved with the IV fluids and she remains stable. She will be admitted to the medical/surgical unit for ongoing evaluation and management of sepsis with bloody diarrhea and CT findings of pancolitis, concerning for infectious colitis or inflammatory bowel disease.  Review of Systems:  All other systems reviewed and apart from HPI, are negative.  Past Medical History:  Diagnosis Date  . Medical history non-contributory     History reviewed. No  pertinent surgical history.   reports that she has never smoked. She has never used smokeless tobacco. She reports that she drinks alcohol. She reports that she does not use drugs.  Allergies  Allergen Reactions  . Shellfish Allergy Swelling    Family History  Problem Relation Age of Onset  . Diabetes Mother   . Hypertension Father      Prior to Admission medications   Medication Sig Start Date End Date Taking? Authorizing Provider  acetaminophen (TYLENOL) 500 MG tablet Take 1,000 mg by mouth every 6 (six) hours as needed for mild pain, moderate pain or headache.   Yes [provider]    Physical Exam: Vitals:   11/20/16 1950 11/21/16 0018  BP: 112/75 112/79  Pulse: (!) 111 85  Resp: 20 20  Temp: (!) 101.1 F (38.4 C) 99.7 F (37.6 C)  TempSrc: Oral Oral  SpO2: 99% 98%      Constitutional: NAD, calm, comfortable Eyes: PERTLA, lids and conjunctivae normal ENMT: Mucous membranes are moist. Posterior pharynx clear of any exudate or lesions.   Neck: normal, supple, no masses, no thyromegaly Respiratory: clear to auscultation bilaterally, no wheezing, no crackles. Normal respiratory effort.   Cardiovascular: S1 & S2 heard, regular rate and rhythm. No extremity edema. No significant JVD. Abdomen: No distension, soft. Generalized tenderness without rebound pain or guarding. Bowel sounds active.  Musculoskeletal: no clubbing / cyanosis. No joint deformity upper and lower extremities.    Skin: no significant rashes, lesions, ulcers. Warm, dry, well-perfused. Neurologic: CN 2-12 grossly intact. Sensation intact, DTR normal. Strength 5/5 in all 4 limbs.  Psychiatric: Alert and  oriented x 3. Very pleasant, cooperative.     Labs on Admission: I have personally reviewed following labs and imaging studies  CBC:  Recent Labs Lab 11/20/16 2313  WBC 11.5*  HGB 13.2  HCT 40.5  MCV 84.2  PLT 051   Basic Metabolic Panel:  Recent Labs Lab 11/20/16 2313  NA 137    K 3.0*  CL 104  CO2 24  GLUCOSE 107*  BUN 6  CREATININE 0.74  CALCIUM 8.7*   GFR: CrCl cannot be calculated (Unknown ideal weight.). Liver Function Tests:  Recent Labs Lab 11/20/16 2313  AST 26  ALT 23  ALKPHOS 60  BILITOT 0.5  PROT 7.3  ALBUMIN 4.1    Recent Labs Lab 11/20/16 2313  LIPASE 19   No results for input(s): AMMONIA in the last 168 hours. Coagulation Profile: No results for input(s): INR, PROTIME in the last 168 hours. Cardiac Enzymes: No results for input(s): CKTOTAL, CKMB, CKMBINDEX, TROPONINI in the last 168 hours. BNP (last 3 results) No results for input(s): PROBNP in the last 8760 hours. HbA1C: No results for input(s): HGBA1C in the last 72 hours. CBG: No results for input(s): GLUCAP in the last 168 hours. Lipid Profile: No results for input(s): CHOL, HDL, LDLCALC, TRIG, CHOLHDL, LDLDIRECT in the last 72 hours. Thyroid Function Tests: No results for input(s): TSH, T4TOTAL, FREET4, T3FREE, THYROIDAB in the last 72 hours. Anemia Panel: No results for input(s): VITAMINB12, FOLATE, FERRITIN, TIBC, IRON, RETICCTPCT in the last 72 hours. Urine analysis:    Component Value Date/Time   COLORURINE YELLOW 11/20/2016 2103   APPEARANCEUR HAZY (A) 11/20/2016 2103   LABSPEC 1.019 11/20/2016 2103   PHURINE 6.0 11/20/2016 2103   GLUCOSEU NEGATIVE 11/20/2016 2103   HGBUR MODERATE (A) 11/20/2016 2103   BILIRUBINUR NEGATIVE 11/20/2016 2103   KETONESUR 5 (A) 11/20/2016 2103   PROTEINUR 30 (A) 11/20/2016 2103   UROBILINOGEN 0.2 03/01/2008 1613   NITRITE NEGATIVE 11/20/2016 2103   LEUKOCYTESUR TRACE (A) 11/20/2016 2103   Sepsis Labs: _0 (procalcitonin:4,lacticidven:4) )No results found for this or any previous visit (from the past 240 hour(s)).   Radiological Exams on Admission: Ct Abdomen Pelvis W Contrast  Result Date: 11/21/2016 CLINICAL DATA:  Diffuse abdominal pain, diarrhea fever and chills EXAM: CT ABDOMEN AND PELVIS WITH CONTRAST  TECHNIQUE: Multidetector CT imaging of the abdomen and pelvis was performed using the standard protocol following bolus administration of intravenous contrast. CONTRAST:  146m ISOVUE-300 IOPAMIDOL (ISOVUE-300) INJECTION 61% COMPARISON:  None. FINDINGS: Lower chest: No acute abnormality. Hepatobiliary: No focal liver abnormality is seen. No gallstones, gallbladder wall thickening, or biliary dilatation. Pancreas: Unremarkable. No pancreatic ductal dilatation or surrounding inflammatory changes. Spleen: Normal in size without focal abnormality. Adrenals/Urinary Tract: Adrenal glands are unremarkable. Kidneys are normal, without renal calculi, focal lesion, or hydronephrosis. Bladder is unremarkable. Stomach/Bowel: Stomach is within normal limits. No dilated small bowel. Diffuse wall thickening of the colon with thickening at the ileocecal junction. Appendix within normal limits. Vascular/Lymphatic: No significant vascular findings are present. No enlarged abdominal or pelvic lymph nodes. Reproductive: Uterus and bilateral adnexa are unremarkable. Other: Small free fluid in the pelvis.  Negative for free air. Musculoskeletal: No acute or significant osseous findings. IMPRESSION: 1. Diffuse colon wall thickening and thickening at the ileocecal valve suspicious for colitis which may be secondary to infection or inflammatory bowel disease. 2. Small amount of free fluid in the pelvis Electronically Signed   By: KDonavan FoilM.D.   On: 11/21/2016 00:56  EKG: Not performed   Assessment/Plan  1. Sepsis secondary to colitis - Pt presents with fevers, crampy abdominal pain, and bloody diarrhea - She is found to be febrile and tachycardic with leukocytosis and CT-findings of pancolitis - Denies FHx of IBD  - She was given 1 liter NS, Cipro, and Flagyl in ED  - Plan to check cultures, GI pathogen panel, C diff, ESR, and CRP - Maintain enteric precautions pending stool studies  - Continue IVF hydration and  empiric abx, monitor and replete lytes   2. Hypokalemia  - Serum potassium is 3.0, secondary to GI-losses  - Treat with 20 mEq oral potassium, 1 g IV magnesium, and addition of KCl to IVF  - Repeat chemistries in am    DVT prophylaxis: SCD's Code Status: Full  Family Communication: Discussed with patient Disposition Plan: Admit to med-surg Consults called: None Admission status: Inpatient    Vianne Bulls, MD [facility] Pager [phone]  If 7PM-7AM, please contact night-coverage [email or web address] Password [email or web address]  11/21/2016, 1:51 AM

## 2017-02-11 ENCOUNTER — Emergency Department (HOSPITAL_COMMUNITY)
Admission: EM | Admit: 2017-02-11 | Discharge: 2017-02-11 | Disposition: A | Discharge status: Home / Self Care | Payer: Self-pay | Attending: Emergency Medicine | Admitting: Emergency Medicine

## 2017-02-11 ENCOUNTER — Other Ambulatory Visit: Payer: Self-pay

## 2017-02-11 ENCOUNTER — Encounter (HOSPITAL_COMMUNITY): Payer: Self-pay | Admitting: Emergency Medicine

## 2017-02-11 DIAGNOSIS — L03116 Cellulitis of left lower limb: Secondary | ICD-10-CM | POA: Insufficient documentation

## 2017-02-11 DIAGNOSIS — W57XXXA Bitten or stung by nonvenomous insect and other nonvenomous arthropods, initial encounter: Secondary | ICD-10-CM | POA: Insufficient documentation

## 2017-02-11 DIAGNOSIS — Z23 Encounter for immunization: Secondary | ICD-10-CM | POA: Insufficient documentation

## 2017-02-11 MED ORDER — BACITRACIN ZINC 500 UNIT/GM EX OINT
TOPICAL_OINTMENT | Freq: Once | CUTANEOUS | Status: AC
  Administered 2017-02-11: 10:00:00 via TOPICAL
  Filled 2017-02-11: qty 0.9

## 2017-02-11 MED ORDER — TETANUS-DIPHTH-ACELL PERTUSSIS 5-2.5-18.5 LF-MCG/0.5 IM SUSP
0.5000 mL | Freq: Once | INTRAMUSCULAR | Status: AC
  Administered 2017-02-11: 0.5 mL via INTRAMUSCULAR
  Filled 2017-02-11: qty 0.5

## 2017-02-11 MED ORDER — CEPHALEXIN 500 MG PO CAPS: 500.0000 mg | ORAL_CAPSULE | Freq: Four times a day (QID) | ORAL | 0 refills | Status: DC

## 2017-02-11 NOTE — ED Triage Notes (Signed)
Pt reports getting bit by a spider on Wednesday while at work and now has fluid blister with redness and swelling to left ankle.

## 2017-02-11 NOTE — Discharge Instructions (Signed)
Apply warm compresses to the area or soak the area in warm water to help continue express drainage.   Keep the wound clean and dry. Gently wash the wound with soap and water and make sure to pat it dry.   Take antibiotic and complete the entire course.   You can take Tylenol or Ibuprofen as directed for pain. You can alternate Tylenol and Ibuprofen every 4 hours. If you take Tylenol at 1pm, then you can take Ibuprofen at 5pm. Then you can take Tylenol again at 9pm.   Followup with PCP  in 2 days for wound recheck.  Return to the Emergency Department if you experienced any worsening/spreading redness or swelling, fever, worsening pain, numbness/weakness of the leg or any other worsening or concerning symptoms.

## 2017-02-11 NOTE — ED Provider Notes (Signed)
[facility] EMERGENCY DEPARTMENT Provider Note   CSN: [id number] Arrival date & time: 02/11/17  0744     History   Chief Complaint Chief Complaint  Patient presents with  . Insect Bite    HPI Nicole Lamb is a 31 y.o. female who presents with grossly worsening pain, redness and swelling noted to the left lower extremity that began after spider bite 2 days ago.  Patient reports that she was at work when a spider bit her on her left lower leg.  Patient reports that since then she is experiencing some pain, redness and swelling to the area.  Patient reports that yesterday a blister developed in the middle.  Patient reports that she has been able to ambulate but reports worsening pain with ambulation.  She has not taken any medication for the pain.  Patient denies any fevers, numbness/weakness.  Her tetanus is not up-to-date.  The history is provided by the patient.    Past Medical History:  Diagnosis Date  . Medical history non-contributory     Patient Active Problem List   Diagnosis Date Noted  . Pancolitis (HCC) 11/21/2016  . Bloody diarrhea 11/21/2016  . Hypokalemia 11/21/2016  . Sepsis (HCC)     No past surgical history on file.  OB History    No data available       Home Medications    Prior to Admission medications   Medication Sig Start Date End Date Taking? Authorizing Provider  acetaminophen (TYLENOL) 500 MG tablet Take 1,000 mg by mouth every 6 (six) hours as needed for mild pain, moderate pain or headache.    [provider]  cephALEXin (KEFLEX) 500 MG capsule Take 1 capsule (500 mg total) 4 (four) times daily by mouth. 02/11/17   Maxwell CaulLayden, Cherissa Hook A, PA-C    Family History Family History  Problem Relation Age of Onset  . Diabetes Mother   . Hypertension Father     Social History Social History   Tobacco Use  . Smoking status: Never Smoker  . Smokeless tobacco: Never Used  Substance Use Topics  . Alcohol use: Yes   Comment: 1-2 per week  . Drug use: No     Allergies   Shellfish allergy   Review of Systems Review of Systems  Constitutional: Negative for fever.  Skin: Positive for color change and wound.  Neurological: Negative for weakness and numbness.     Physical Exam Updated Vital Signs BP 110/65 (BP Location: Right Arm)   Pulse 66   Temp 98.5 F (36.9 C) (Oral)   Resp 16   SpO2 99%   Physical Exam  Constitutional: She appears well-developed and well-nourished.  HENT:  Head: Normocephalic and atraumatic.  Eyes: Conjunctivae and EOM are normal. Right eye exhibits no discharge. Left eye exhibits no discharge. No scleral icterus.  Cardiovascular:  Pulses:      Dorsalis pedis pulses are 2+ on the right side, and 2+ on the left side.  Pulmonary/Chest: Effort normal.  Musculoskeletal:  FROM leftt ankle without difficulty. Dorsiflexion and plantarflexion of left ankle intact without difficulty. Able to move all five digits of left foot without difficulty.   Neurological: She is alert.  Sensation intact along major nerve distributions of BLE  Skin: Skin is warm and dry. Capillary refill takes less than 2 seconds.     LLE is not dusky in appearance or cool to touch. 4.5 cm area of warmth and erythema with a central spot of fluctuance noted to  the medial aspect of the mid distal LLE. No evidence of necrosis.  Psychiatric: She has a normal mood and affect. Her speech is normal and behavior is normal.  Nursing note and vitals reviewed.    ED Treatments / Results  Labs (all labs ordered are listed, but only abnormal results are displayed) Labs Reviewed - No data to display  EKG  EKG Interpretation None       Radiology No results found.  Procedures .Marland Kitchen.Incision and Drainage Date/Time: 02/11/2017 9:50 AM Performed by: Maxwell CaulLayden, Elliett Guarisco A, PA-C Authorized by: Maxwell CaulLayden, Gloria Ricardo A, PA-C   Consent:    Consent obtained:  Verbal   Consent given by:  Patient   Risks discussed:   Incomplete drainage, bleeding and infection Location:    Type:  Abscess   Location:  Lower extremity   Lower extremity location:  Leg Pre-procedure details:    Skin preparation:  Antiseptic wash Procedure details:    Needle aspiration: yes     Needle size:  25 G   Drainage:  Serosanguinous   Drainage amount:  Scant Post-procedure details:    Patient tolerance of procedure:  Tolerated well, no immediate complications   (including critical care time)  Medications Ordered in ED Medications  Tdap (BOOSTRIX) injection 0.5 mL (0.5 mLs Intramuscular Given 02/11/17 0933)  bacitracin ointment ( Topical Given 02/11/17 0937)     Initial Impression / Assessment and Plan / ED Course  I have reviewed the triage vital signs and the nursing notes.  Pertinent labs & imaging results that were available during my care of the patient were reviewed by me and considered in my medical decision making (see chart for details).     31 y.o. F who presents with pain, redness and swelling noted to the left lower symptoms occurred after spider bite that occurred 2 days ago.  Patient reports that yesterday she noticed a small area fluid collection developed in the middle.  No Fevers, chills, numbness/weakness of the foot. Patient is afebrile, non-toxic appearing, sitting comfortably on examination table. Vital signs reviewed and stable. Patient is neurovascularly intact.  Physical exam shows a 4.5 cm area of erythema and warmth and induration noted to the distal left lower extremity.  There is some mild soft tissue swelling surrounding the area.  There is a central area of fluctuance.  Discussed with patient.  Unclear if this is a blister or an abscess.  We discussed treatment options.  We will plan to do a needle aspiration of fluid for evaluation of purulent material.  I&D as documented above.  Needle aspiration was performed with serosanguineous drainage.  No purulent drainage noted.  Further I&D of the site was  not performed given lack of purulent material.  Given that patient has some surrounding concerning symptoms for cellulitis, will start abx therapy. Patient with no known drug allergies.  Wound care instructions discussed with patient.  Patient instructed to follow-up with her primary care doctor in the next 24-48 hours for further evaluation. Strict return precautions discussed. Patient expresses understanding and agreement to plan.    Final Clinical Impressions(s) / ED Diagnoses   Final diagnoses:  Insect bite, initial encounter  Cellulitis of left lower extremity    ED Discharge Orders        Ordered    cephALEXin (KEFLEX) 500 MG capsule  4 times daily     02/11/17 0927       Maxwell CaulLayden, Gianne Shugars A, PA-C 02/11/17 1742    Pricilla LovelessGoldston, Scott, MD 02/12/17 1954

## 2017-05-16 ENCOUNTER — Emergency Department (HOSPITAL_COMMUNITY)
Admission: EM | Admit: 2017-05-16 | Discharge: 2017-05-16 | Disposition: A | Discharge status: Home / Self Care | Payer: Self-pay | Attending: Emergency Medicine | Admitting: Emergency Medicine

## 2017-05-16 ENCOUNTER — Other Ambulatory Visit: Payer: Self-pay

## 2017-05-16 ENCOUNTER — Encounter (HOSPITAL_COMMUNITY): Payer: Self-pay

## 2017-05-16 ENCOUNTER — Emergency Department (HOSPITAL_COMMUNITY): Payer: Self-pay

## 2017-05-16 DIAGNOSIS — J189 Pneumonia, unspecified organism: Secondary | ICD-10-CM | POA: Insufficient documentation

## 2017-05-16 MED ORDER — DOXYCYCLINE HYCLATE 100 MG PO TABS
100.0000 mg | ORAL_TABLET | Freq: Once | ORAL | Status: AC
  Administered 2017-05-16: 100 mg via ORAL
  Filled 2017-05-16: qty 1

## 2017-05-16 MED ORDER — DOXYCYCLINE HYCLATE 100 MG PO CAPS: 100.0000 mg | ORAL_CAPSULE | Freq: Two times a day (BID) | ORAL | 0 refills | Status: DC

## 2017-05-16 MED ORDER — ACETAMINOPHEN 325 MG PO TABS
650.0000 mg | ORAL_TABLET | Freq: Once | ORAL | Status: AC
  Administered 2017-05-16: 650 mg via ORAL
  Filled 2017-05-16: qty 2

## 2017-05-16 MED ORDER — ONDANSETRON 4 MG PO TBDP: 4.0000 mg | ORAL_TABLET | Freq: Three times a day (TID) | ORAL | 0 refills | Status: DC | PRN

## 2017-05-16 MED ORDER — ONDANSETRON 4 MG PO TBDP: 4.0000 mg | ORAL_TABLET | Freq: Once | ORAL | Status: DC

## 2017-05-16 MED ORDER — OSELTAMIVIR PHOSPHATE 75 MG PO CAPS: 75.0000 mg | ORAL_CAPSULE | Freq: Two times a day (BID) | ORAL | 0 refills | Status: DC

## 2017-05-16 MED ORDER — ONDANSETRON 4 MG PO TBDP
4.0000 mg | ORAL_TABLET | Freq: Once | ORAL | Status: AC
  Administered 2017-05-16: 4 mg via ORAL
  Filled 2017-05-16: qty 1

## 2017-05-16 NOTE — ED Triage Notes (Signed)
Per Pt, Pt is coming from home with chills, cough, and generalized body aches since Friday. Denies any N/V/D

## 2017-05-16 NOTE — Discharge Instructions (Signed)
Please read attached information. If you experience any new or worsening signs or symptoms please return to the emergency room for evaluation. Please follow-up with your primary care provider or specialist as discussed. Please use medication prescribed only as directed and discontinue taking if you have any concerning signs or symptoms.   °

## 2017-05-16 NOTE — ED Provider Notes (Signed)
[facility] Provider Note   CSN: [id number] Arrival date & time: 05/16/17  1229     History   Chief Complaint Chief Complaint  Patient presents with  . Generalized Body Aches  . Cough    HPI Nicole Lamb is a 32 y.o. female.  HPI   32 year old female presents today with complaints of cough and generalized body aches.  Patient notes symptoms started 5 days ago with productive cough, fever and chills, body aches.  Patient denies any significant chest pain or shortness of breath.  She denies any nausea or vomiting.  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  She denies any significant past medical history, reports no smoking history, not taking any medications.  Past Medical History:  Diagnosis Date  . Medical history non-contributory     Patient Active Problem List   Diagnosis Date Noted  . Pancolitis (HCC) 11/21/2016  . Bloody diarrhea 11/21/2016  . Hypokalemia 11/21/2016  . Sepsis (HCC)     History reviewed. No pertinent surgical history.  OB History    No data available      Home Medications    Prior to Admission medications   Medication Sig Start Date End Date Taking? Authorizing Provider  acetaminophen (TYLENOL) 500 MG tablet Take 1,000 mg by mouth every 6 (six) hours as needed for mild pain, moderate pain or headache.    [provider]  cephALEXin (KEFLEX) 500 MG capsule Take 1 capsule (500 mg total) 4 (four) times daily by mouth. 02/11/17   Maxwell CaulLayden, Lindsey A, PA-C  doxycycline (VIBRAMYCIN) 100 MG capsule Take 1 capsule (100 mg total) by mouth 2 (two) times daily. 05/16/17   Burton Gahan, Tinnie GensJeffrey, PA-C  ondansetron (ZOFRAN ODT) 4 MG disintegrating tablet Take 1 tablet (4 mg total) by mouth every 8 (eight) hours as needed for nausea or vomiting. 05/16/17   Martel Galvan, Tinnie GensJeffrey, PA-C  oseltamivir (TAMIFLU) 75 MG capsule Take 1 capsule (75 mg total) by mouth every 12 (twelve) hours. 05/16/17   Eyvonne MechanicHedges,  Marayah Higdon, PA-C    Family History Family History  Problem Relation Age of Onset  . Diabetes Mother   . Hypertension Father     Social History Social History   Tobacco Use  . Smoking status: Never Smoker  . Smokeless tobacco: Never Used  Substance Use Topics  . Alcohol use: Yes    Comment: 1-2 per week  . Drug use: No     Allergies   Shellfish allergy   Review of Systems Review of Systems  All other systems reviewed and are negative.  Physical Exam Updated Vital Signs BP 119/73 (BP Location: Right Arm)   Pulse 100   Temp 100.1 F (37.8 C) (Oral)   Resp 18   Ht 5\' 5"  (1.651 m)   Wt 72.6 kg (160 lb)   SpO2 98%   BMI 26.63 kg/m   Physical Exam  Constitutional: She is oriented to person, place, and time. She appears well-developed and well-nourished.  HENT:  Head: Normocephalic and atraumatic.  Eyes: Conjunctivae are normal. Pupils are equal, round, and reactive to light. Right eye exhibits no discharge. Left eye exhibits no discharge. No scleral icterus.  Neck: Normal range of motion. No JVD present. No tracheal deviation present.  Pulmonary/Chest: Effort normal. No stridor.  Neurological: She is alert and oriented to person, place, and time. Coordination normal.  Psychiatric: She has a normal mood and affect. Her behavior is normal. Judgment and thought content  normal.  Nursing note and vitals reviewed.   ED Treatments / Results  Labs (all labs ordered are listed, but only abnormal results are displayed) Labs Reviewed  INFLUENZA PANEL BY PCR (TYPE A & B)    EKG  EKG Interpretation None      Radiology Dg Chest 2 View  Result Date: 05/16/2017 CLINICAL DATA:  Chest pain chills and headache. EXAM: CHEST  2 VIEW COMPARISON:  Chest radiograph 08/29/2005 FINDINGS: Mildly increased airspace opacity in the right lower lobe without confluent consolidation. The lungs are otherwise clear. No pulmonary edema, pneumothorax or pleural effusion. Normal  cardiomediastinal contours. IMPRESSION: Mildly increased opacity in the right lower lobe which could indicate developing consolidation, including the possibility of pneumonia. Electronically Signed   By: Deatra Robinson M.D.   On: 05/16/2017 13:36    Procedures Procedures (including critical care time)  Medications Ordered in ED Medications  ondansetron (ZOFRAN-ODT) disintegrating tablet 4 mg (not administered)  doxycycline (VIBRA-TABS) tablet 100 mg (100 mg Oral Given 05/16/17 1431)  acetaminophen (TYLENOL) tablet 650 mg (650 mg Oral Given 05/16/17 1431)  ondansetron (ZOFRAN-ODT) disintegrating tablet 4 mg (4 mg Oral Given 05/16/17 1530)    Initial Impression / Assessment and Plan / ED Course  I have reviewed the triage vital signs and the nursing notes.  Pertinent labs & imaging results that were available during my care of the patient were reviewed by me and considered in my medical decision making (see chart for details).      Final Clinical Impressions(s) / ED Diagnoses   Final diagnoses:  Community acquired pneumonia, unspecified laterality    Labs: Influenza  Imaging: DG chest 2 view  Consults:  Therapeutics: Tylenol, doxycycline, Zofran, influenza  Discharge Meds:   Assessment/Plan: 32 year old female presents today with likely pneumonia.  Patient is otherwise a healthy female with no comp gated past medical history.  Patient will receive influenza test here today, if she is positive medications will be called into her pharmacy.,  She will be discharged home on doxycycline, she is instructed to return immediately with any new or worsening signs or symptoms.  Patient verbalized understanding and agreement to today's plan had no further questions or concerns at discharge.  Patient with no nausea or vomiting prior to taking doxycycline, she was slightly nauseous here with an episode of vomiting.  Tolerating p.o. after Zofran.  After discharge I did not see results in process  for patient's influenza swab that was ordered.  Nursing staff reassured that this was ordered and sent.  Laboratory was called reported that they did not receive the swab.  I called the patient and advised that she return to the emergency room to be re-swab, she reports that she would be unable to return to the emergency room at this time.  Rather than returning I find it appropriate that patient be prophylactically started on Tamiflu, I have called in a prescription to her pharmacy and encouraged her to follow-up with the pharmacist receive the medication and initiate treatment today, she verbalized that she would do this.     ED Discharge Orders        Ordered    ondansetron (ZOFRAN ODT) 4 MG disintegrating tablet  Every 8 hours PRN     05/16/17 1619    doxycycline (VIBRAMYCIN) 100 MG capsule  2 times daily     05/16/17 1621    oseltamivir (TAMIFLU) 75 MG capsule  Every 12 hours     05/16/17 1653  Eyvonne Mechanic, PA-C 05/16/17 1901    Gwyneth Sprout, MD 05/17/17 1622

## 2017-05-16 NOTE — ED Notes (Signed)
Pt in room

## 2019-03-09 ENCOUNTER — Other Ambulatory Visit: Payer: Self-pay

## 2019-03-09 DIAGNOSIS — Z20822 Contact with and (suspected) exposure to covid-19: Secondary | ICD-10-CM

## 2019-03-12 LAB — NOVEL CORONAVIRUS, NAA: SARS-CoV-2, NAA: NOT DETECTED

## 2019-11-23 ENCOUNTER — Encounter: Payer: Self-pay | Admitting: Gastroenterology

## 2019-11-25 ENCOUNTER — Other Ambulatory Visit: Payer: Self-pay

## 2019-11-25 ENCOUNTER — Encounter (HOSPITAL_COMMUNITY): Payer: Self-pay | Admitting: Emergency Medicine

## 2019-11-25 ENCOUNTER — Emergency Department (HOSPITAL_COMMUNITY)
Admission: EM | Admit: 2019-11-25 | Discharge: 2019-11-25 | Disposition: A | Discharge status: Home / Self Care | Payer: BC Managed Care – PPO | Attending: Emergency Medicine | Admitting: Emergency Medicine

## 2019-11-25 ENCOUNTER — Emergency Department (HOSPITAL_COMMUNITY): Payer: BC Managed Care – PPO

## 2019-11-25 DIAGNOSIS — K529 Noninfective gastroenteritis and colitis, unspecified: Secondary | ICD-10-CM | POA: Insufficient documentation

## 2019-11-25 DIAGNOSIS — R197 Diarrhea, unspecified: Secondary | ICD-10-CM | POA: Diagnosis present

## 2019-11-25 LAB — CBC
HCT: 44.6 % (ref 36.0–46.0)
Hemoglobin: 13.7 g/dL (ref 12.0–15.0)
MCH: 27.7 pg (ref 26.0–34.0)
MCHC: 30.7 g/dL (ref 30.0–36.0)
MCV: 90.3 fL (ref 80.0–100.0)
Platelets: 282 10*3/uL (ref 150–400)
RBC: 4.94 MIL/uL (ref 3.87–5.11)
RDW: 13.8 % (ref 11.5–15.5)
WBC: 4.8 10*3/uL (ref 4.0–10.5)
nRBC: 0 % (ref 0.0–0.2)

## 2019-11-25 LAB — URINALYSIS, ROUTINE W REFLEX MICROSCOPIC
Bacteria, UA: NONE SEEN
Bilirubin Urine: NEGATIVE
Glucose, UA: NEGATIVE mg/dL
Ketones, ur: NEGATIVE mg/dL
Leukocytes,Ua: NEGATIVE
Nitrite: NEGATIVE
Protein, ur: NEGATIVE mg/dL
Specific Gravity, Urine: 1.024 (ref 1.005–1.030)
pH: 6 (ref 5.0–8.0)

## 2019-11-25 LAB — COMPREHENSIVE METABOLIC PANEL
ALT: 14 U/L (ref 0–44)
AST: 21 U/L (ref 15–41)
Albumin: 4.6 g/dL (ref 3.5–5.0)
Alkaline Phosphatase: 60 U/L (ref 38–126)
Anion gap: 10 (ref 5–15)
BUN: 9 mg/dL (ref 6–20)
CO2: 25 mmol/L (ref 22–32)
Calcium: 9.4 mg/dL (ref 8.9–10.3)
Chloride: 105 mmol/L (ref 98–111)
Creatinine, Ser: 0.79 mg/dL (ref 0.44–1.00)
GFR calc Af Amer: >60 mL/min (ref >60)
GFR calc non Af Amer: >60 mL/min (ref >60)
Glucose, Bld: 113 mg/dL — ABNORMAL HIGH (ref 70–99)
Potassium: 4 mmol/L (ref 3.5–5.1)
Sodium: 140 mmol/L (ref 135–145)
Total Bilirubin: 0.7 mg/dL (ref 0.3–1.2)
Total Protein: 8.3 g/dL — ABNORMAL HIGH (ref 6.5–8.1)

## 2019-11-25 LAB — LIPASE, BLOOD: Lipase: 25 U/L (ref 11–51)

## 2019-11-25 LAB — I-STAT BETA HCG BLOOD, ED (MC, WL, AP ONLY): I-stat hCG, quantitative: <5 m[IU]/mL (ref <5)

## 2019-11-25 MED ORDER — DICYCLOMINE HCL 20 MG PO TABS: 20.0000 mg | ORAL_TABLET | Freq: Two times a day (BID) | ORAL | 0 refills | Status: DC

## 2019-11-25 MED ORDER — SODIUM CHLORIDE 0.9 % IV BOLUS
1000.0000 mL | Freq: Once | INTRAVENOUS | Status: AC
  Administered 2019-11-25: 1000 mL via INTRAVENOUS

## 2019-11-25 MED ORDER — IOHEXOL 300 MG/ML  SOLN
100.0000 mL | Freq: Once | INTRAMUSCULAR | Status: AC | PRN
  Administered 2019-11-25: 100 mL via INTRAVENOUS

## 2019-11-25 MED ORDER — CIPROFLOXACIN HCL 500 MG PO TABS: 500.0000 mg | ORAL_TABLET | Freq: Two times a day (BID) | ORAL | 0 refills | Status: AC

## 2019-11-25 MED ORDER — SODIUM CHLORIDE (PF) 0.9 % IJ SOLN
INTRAMUSCULAR | Status: AC
  Filled 2019-11-25: qty 50

## 2019-11-25 MED ORDER — METRONIDAZOLE 500 MG PO TABS: 500.0000 mg | ORAL_TABLET | Freq: Three times a day (TID) | ORAL | 0 refills | Status: AC

## 2019-11-25 NOTE — ED Provider Notes (Signed)
[facility]-EMERGENCY DEPT Provider Note   CSN: [id number] Arrival date & time: 11/25/19  1754     History Chief Complaint  Patient presents with  . Abdominal Pain  . Diarrhea    Nicole Lamb is a 34 y.o. female.  HPI  34 year old female with 1 day history of diarrhea and abdominal pain.  Patient states that she been having some constipation, had 1 bowel movement with mild bright red blood on Wednesday.  Denies have any more blood in her stool since.  No history of hemorrhoids.  States she took some prune juice on Thursday, and was still not having any bowel movements.  She states she took a detox tea yesterday and again this morning.  She states that she started to have abdominal pain and watery diarrhea for the last 2 days.  Pain is mostly periumbilical.  Does not think she ate any food that would precipitate this.  Denies any nausea or vomiting.  Denies any fevers or chills.  Denies any vaginal bleeding, discharge, odors or pain.  Has a history of pancolitis and sepsis per chart review.  No recent antibiotic use.  No recent travel.  Denies any drug use, states she will have a glass of wine in the evenings.    Past Medical History:  Diagnosis Date  . Medical history non-contributory     Patient Active Problem List   Diagnosis Date Noted  . Pancolitis (HCC) 11/21/2016  . Bloody diarrhea 11/21/2016  . Hypokalemia 11/21/2016  . Sepsis (HCC)     History reviewed. No pertinent surgical history.   OB History   No obstetric history on file.     Family History  Problem Relation Age of Onset  . Diabetes Mother   . Hypertension Father     Social History   Tobacco Use  . Smoking status: Never Smoker  . Smokeless tobacco: Never Used  Vaping Use  . Vaping Use: Never used  Substance Use Topics  . Alcohol use: Yes    Comment: 1-2 per week  . Drug use: No    Home Medications Prior to Admission medications   Medication Sig Start Date End Date  Taking? Authorizing Provider  acetaminophen (TYLENOL) 500 MG tablet Take 1,000 mg by mouth every 6 (six) hours as needed for mild pain, moderate pain or headache.    [provider]  cephALEXin (KEFLEX) 500 MG capsule Take 1 capsule (500 mg total) 4 (four) times daily by mouth. 02/11/17   Maxwell Caul, PA-C  ciprofloxacin (CIPRO) 500 MG tablet Take 1 tablet (500 mg total) by mouth every 12 (twelve) hours for 7 days. 11/25/19 12/02/19  Mare Ferrari, PA-C  dicyclomine (BENTYL) 20 MG tablet Take 1 tablet (20 mg total) by mouth 2 (two) times daily. 11/25/19   Mare Ferrari, PA-C  doxycycline (VIBRAMYCIN) 100 MG capsule Take 1 capsule (100 mg total) by mouth 2 (two) times daily. 05/16/17   Hedges, Tinnie Gens, PA-C  metroNIDAZOLE (FLAGYL) 500 MG tablet Take 1 tablet (500 mg total) by mouth 3 (three) times daily for 7 days. 11/25/19 12/02/19  Mare Ferrari, PA-C  ondansetron (ZOFRAN ODT) 4 MG disintegrating tablet Take 1 tablet (4 mg total) by mouth every 8 (eight) hours as needed for nausea or vomiting. 05/16/17   Hedges, Tinnie Gens, PA-C  oseltamivir (TAMIFLU) 75 MG capsule Take 1 capsule (75 mg total) by mouth every 12 (twelve) hours. 05/16/17   Eyvonne Mechanic, PA-C    Allergies  Shellfish allergy  Review of Systems   Review of Systems  Constitutional: Negative for chills and fever.  HENT: Negative for ear pain and sore throat.   Eyes: Negative for pain and visual disturbance.  Respiratory: Negative for cough and shortness of breath.   Cardiovascular: Negative for chest pain and palpitations.  Gastrointestinal: Positive for abdominal pain, blood in stool and diarrhea. Negative for rectal pain and vomiting.  Genitourinary: Negative for dysuria and hematuria.  Musculoskeletal: Negative for arthralgias and back pain.  Skin: Negative for color change and rash.  Neurological: Negative for seizures and syncope.  Psychiatric/Behavioral: Negative for confusion.  All other systems reviewed and  are negative.   Physical Exam Updated Vital Signs BP 134/85   Pulse 76   Temp 98.2 F (36.8 C) (Oral)   Resp 19   Ht 5\' 5"  (1.651 m)   Wt 86.2 kg   SpO2 98%   BMI 31.62 kg/m   Physical Exam Vitals reviewed.  Constitutional:      General: She is not in acute distress.    Appearance: Normal appearance. She is well-developed. She is not ill-appearing, toxic-appearing or diaphoretic.  HENT:     Head: Normocephalic and atraumatic.  Eyes:     General:        Right eye: No discharge.        Left eye: No discharge.     Extraocular Movements: Extraocular movements intact.     Conjunctiva/sclera: Conjunctivae normal.  Cardiovascular:     Rate and Rhythm: Normal rate and regular rhythm.     Heart sounds: Normal heart sounds.  Pulmonary:     Effort: Pulmonary effort is normal.     Breath sounds: Normal breath sounds.  Abdominal:     General: Abdomen is flat. There is no distension.     Tenderness: There is abdominal tenderness in the periumbilical area. There is no right CVA tenderness, left CVA tenderness or guarding. Negative signs include Murphy's sign, McBurney's sign and obturator sign.     Hernia: No hernia is present.  Musculoskeletal:        General: No swelling. Normal range of motion.  Neurological:     General: No focal deficit present.     Mental Status: She is alert and oriented to person, place, and time.  Psychiatric:        Mood and Affect: Mood normal.        Behavior: Behavior normal.     ED Results / Procedures / Treatments   Labs (all labs ordered are listed, but only abnormal results are displayed) Labs Reviewed  COMPREHENSIVE METABOLIC PANEL - Abnormal; Notable for the following components:      Result Value   Glucose, Bld 113 (*)    Total Protein 8.3 (*)    All other components within normal limits  URINALYSIS, ROUTINE W REFLEX MICROSCOPIC - Abnormal; Notable for the following components:   Hgb urine dipstick MODERATE (*)    All other components  within normal limits  GASTROINTESTINAL PANEL BY PCR, STOOL (REPLACES STOOL CULTURE)  LIPASE, BLOOD  CBC  I-STAT BETA HCG BLOOD, ED ([facility], [facility], [facility] ONLY)    EKG None  Radiology CT ABDOMEN PELVIS W CONTRAST  Result Date: 11/25/2019 CLINICAL DATA:  34 year old female with acute abdominal pain. EXAM: CT ABDOMEN AND PELVIS WITH CONTRAST TECHNIQUE: Multidetector CT imaging of the abdomen and pelvis was performed using the standard protocol following bolus administration of intravenous contrast. CONTRAST:  34 OMNIPAQUE IOHEXOL 300 MG/ML  SOLN COMPARISON:  CT abdomen pelvis dated 11/21/2016. FINDINGS: Lower chest: The visualized lung bases are clear. No intra-abdominal free air.  Trace free fluid in the pelvis. Hepatobiliary: No focal liver abnormality is seen. No gallstones, gallbladder wall thickening, or biliary dilatation. Pancreas: Unremarkable. No pancreatic ductal dilatation or surrounding inflammatory changes. Spleen: Normal in size without focal abnormality. Adrenals/Urinary Tract: The adrenal glands are unremarkable. There is no hydronephrosis on either side. There is apparent slight heterogeneous enhancement of the right kidney. Correlation with urinalysis recommended to evaluate for possibility of pyelonephritis. The visualized ureters and urinary bladder appear unremarkable. Stomach/Bowel: Diffusely thickened appearance of the colon may be related to underdistention or represent mild colitis. Clinical correlation is recommended. There is no bowel obstruction. The appendix is normal. Vascular/Lymphatic: The abdominal aorta and IVC are unremarkable. No portal venous gas. There is no adenopathy. Reproductive: The uterus is anteverted.  No adnexal masses. Other: None Musculoskeletal: No acute or significant osseous findings. IMPRESSION: 1. Underdistention of the colon versus mild colitis. Clinical correlation is recommended. No bowel obstruction. Normal appendix. 2. Slight heterogeneous enhancement of  the right kidney. Correlation with urinalysis recommended to evaluate for possibility of pyelonephritis. Electronically Signed   By: Elgie CollardArash  Radparvar M.D.   On: 11/25/2019 23:27    Procedures Procedures (including critical care time)  Medications Ordered in ED Medications  sodium chloride (PF) 0.9 % injection (has no administration in time range)  sodium chloride 0.9 % bolus 1,000 mL (1,000 mLs Intravenous New Bag/Given 11/25/19 2321)  iohexol (OMNIPAQUE) 300 MG/ML solution 100 mL (100 mLs Intravenous Contrast Given 11/25/19 2304)    ED Course  I have reviewed the triage vital signs and the nursing notes.  Pertinent labs & imaging results that were available during my care of the patient were reviewed by me and considered in my medical decision making (see chart for details).    MDM Rules/Calculators/A&P                         34 year old female with abdominal pain and diarrhea x2 days On presentation, she is alert, oriented, nontoxic-appearing, no acute distress, resting comfortably in ER bed.  Vital signs are all reassuring, she is afebrile not tachycardic.  Physical exam with periumbilical tenderness to palpation.  CBC without leukocytosis, normal hemoglobin. CMP without any electrolyte abnormalities, normal renal and liver function tests. Lipase normal. UA without evidence of UTI over with moderate hemoglobin consistent with the patient's menstrual cycle. Suspect that the patient's symptoms are likely due to her recent use of detox tea and prune juice.  However partook in shared decision making, and given the patient's history of pancolitis in sepsis, she would prefer to have a CT of her abdomen to rule out diverticulitis, appendicitis, toxic megacolon.   CT scan with questionable mild colitis which seems consistent with patient's symptoms. Also noted possible pyelonephritis however UA not consistent and patient is not having any urinary symptoms or flank pain.  No evidence of sepsis.  PT  was not able to provide stool culture here in the ED. Will send cipro and flagyl.  Bentyl for abdominal pain.  Pt given handout for foods to help with diarrhea and instructed to take over the counter lomotil.  Discussed that the patient should return if her diarrhea does not resolve, worsens, or she has any new fevers, nausea, vomiting or diarrhea.  She voices understanding and is agreeable.  Final Clinical Impression(s) / ED Diagnoses Final diagnoses:  Colitis  Rx / DC Orders ED Discharge Orders         Ordered    dicyclomine (BENTYL) 20 MG tablet  2 times daily,   Status:  Discontinued        11/25/19 2315    ciprofloxacin (CIPRO) 500 MG tablet  Every 12 hours        11/25/19 2335    metroNIDAZOLE (FLAGYL) 500 MG tablet  3 times daily        11/25/19 2335    dicyclomine (BENTYL) 20 MG tablet  2 times daily        11/25/19 2342           Leone Brand 11/25/19 2344    Linwood Dibbles, MD 11/27/19 1245

## 2019-11-25 NOTE — ED Triage Notes (Signed)
Pt having mid abd pains with diarrhea today. Reports on Wed had blood in stool but none today. Took a tea cleanse yesterday and then again some this morning.

## 2019-11-25 NOTE — Discharge Instructions (Addendum)
Your work-up today was overall reassuring. You may take over-the-counter Lomotil to help with diarrhea, take prescribed Bentyl for abdominal pain.  Please reintroduce food gradually, eat foods that help relieve diarrhea which I provided a handout for.  Please take the antibiotics as prescribed until finished in order to prevent worsening infection or antibiotic resistance.  Return to the ER if your symptoms worsen.

## 2019-12-09 ENCOUNTER — Emergency Department (HOSPITAL_COMMUNITY)
Admission: EM | Admit: 2019-12-09 | Discharge: 2019-12-09 | Disposition: A | Discharge status: Home / Self Care | Payer: BC Managed Care – PPO | Attending: Emergency Medicine | Admitting: Emergency Medicine

## 2019-12-09 ENCOUNTER — Other Ambulatory Visit: Payer: Self-pay

## 2019-12-09 ENCOUNTER — Encounter (HOSPITAL_COMMUNITY): Payer: Self-pay | Admitting: Emergency Medicine

## 2019-12-09 DIAGNOSIS — Z5321 Procedure and treatment not carried out due to patient leaving prior to being seen by health care provider: Secondary | ICD-10-CM | POA: Diagnosis not present

## 2019-12-09 DIAGNOSIS — K602 Anal fissure, unspecified: Secondary | ICD-10-CM | POA: Diagnosis not present

## 2019-12-09 DIAGNOSIS — K625 Hemorrhage of anus and rectum: Secondary | ICD-10-CM

## 2019-12-09 DIAGNOSIS — K921 Melena: Secondary | ICD-10-CM | POA: Insufficient documentation

## 2019-12-09 DIAGNOSIS — K5900 Constipation, unspecified: Secondary | ICD-10-CM | POA: Diagnosis present

## 2019-12-09 DIAGNOSIS — Z79899 Other long term (current) drug therapy: Secondary | ICD-10-CM | POA: Insufficient documentation

## 2019-12-09 LAB — COMPREHENSIVE METABOLIC PANEL
ALT: 17 U/L (ref 0–44)
AST: 19 U/L (ref 15–41)
Albumin: 4.3 g/dL (ref 3.5–5.0)
Alkaline Phosphatase: 55 U/L (ref 38–126)
Anion gap: 12 (ref 5–15)
BUN: 9 mg/dL (ref 6–20)
CO2: 22 mmol/L (ref 22–32)
Calcium: 9.3 mg/dL (ref 8.9–10.3)
Chloride: 106 mmol/L (ref 98–111)
Creatinine, Ser: 0.77 mg/dL (ref 0.44–1.00)
GFR calc Af Amer: >60 mL/min (ref >60)
GFR calc non Af Amer: >60 mL/min (ref >60)
Glucose, Bld: 95 mg/dL (ref 70–99)
Potassium: 3.8 mmol/L (ref 3.5–5.1)
Sodium: 140 mmol/L (ref 135–145)
Total Bilirubin: 0.6 mg/dL (ref 0.3–1.2)
Total Protein: 7.9 g/dL (ref 6.5–8.1)

## 2019-12-09 LAB — CBC
HCT: 44.2 % (ref 36.0–46.0)
Hemoglobin: 13.5 g/dL (ref 12.0–15.0)
MCH: 27 pg (ref 26.0–34.0)
MCHC: 30.5 g/dL (ref 30.0–36.0)
MCV: 88.4 fL (ref 80.0–100.0)
Platelets: 283 10*3/uL (ref 150–400)
RBC: 5 MIL/uL (ref 3.87–5.11)
RDW: 13.6 % (ref 11.5–15.5)
WBC: 4.8 10*3/uL (ref 4.0–10.5)
nRBC: 0 % (ref 0.0–0.2)

## 2019-12-09 LAB — POC OCCULT BLOOD, ED: Fecal Occult Bld: POSITIVE — AB

## 2019-12-09 LAB — I-STAT BETA HCG BLOOD, ED (MC, WL, AP ONLY): I-stat hCG, quantitative: <5 m[IU]/mL (ref <5)

## 2019-12-09 LAB — TYPE AND SCREEN
ABO/RH(D): A POS
Antibody Screen: NEGATIVE

## 2019-12-09 NOTE — ED Provider Notes (Signed)
[facility] EMERGENCY DEPARTMENT Provider Note   CSN: [id number] Arrival date & time: 12/09/19  2025     History Chief Complaint  Patient presents with  . GI Bleeding    Nicole Lamb is a 34 y.o. female.  HPI     The patient was recently seen for abdominal symptoms and was ultimately diagnosed with mild colitis possibly.  She was prescribed antibiotics, she did very well and did much better.  She states that over the last 2 days she has developed some constipation, straining on her stools and today has a hard brown stool followed by some bright red blood.  She is concerned that she had she is noted to coagulopathy.  Past Medical History:  Diagnosis Date  . Medical history non-contributory     Patient Active Problem List   Diagnosis Date Noted  . Pancolitis (HCC) 11/21/2016  . Bloody diarrhea 11/21/2016  . Hypokalemia 11/21/2016  . Sepsis (HCC)     History reviewed. No pertinent surgical history.   OB History   No obstetric history on file.     Family History  Problem Relation Age of Onset  . Diabetes Mother   . Hypertension Father     Social History   Tobacco Use  . Smoking status: Never Smoker  . Smokeless tobacco: Never Used  Vaping Use  . Vaping Use: Never used  Substance Use Topics  . Alcohol use: Yes    Comment: 1-2 per week  . Drug use: No    Home Medications Prior to Admission medications   Medication Sig Start Date End Date Taking? Authorizing Provider  acetaminophen (TYLENOL) 500 MG tablet Take 1,000 mg by mouth every 6 (six) hours as needed for mild pain, moderate pain or headache.    [provider]  cephALEXin (KEFLEX) 500 MG capsule Take 1 capsule (500 mg total) 4 (four) times daily by mouth. 02/11/17   Maxwell Caul, PA-C  dicyclomine (BENTYL) 20 MG tablet Take 1 tablet (20 mg total) by mouth 2 (two) times daily. 11/25/19   Mare Ferrari, PA-C  doxycycline (VIBRAMYCIN) 100 MG capsule Take 1 capsule (100 mg  total) by mouth 2 (two) times daily. 05/16/17   Hedges, Tinnie Gens, PA-C  ondansetron (ZOFRAN ODT) 4 MG disintegrating tablet Take 1 tablet (4 mg total) by mouth every 8 (eight) hours as needed for nausea or vomiting. 05/16/17   Hedges, Tinnie Gens, PA-C  oseltamivir (TAMIFLU) 75 MG capsule Take 1 capsule (75 mg total) by mouth every 12 (twelve) hours. 05/16/17   Hedges, Tinnie Gens, PA-C    Allergies    Bee venom, Shellfish allergy, and Povidone-iodine  Review of Systems   Review of Systems  All other systems reviewed and are negative.   Physical Exam Updated Vital Signs BP 117/83 (BP Location: Left Arm)   Pulse 72   Temp 98.3 F (36.8 C) (Oral)   Resp 18   SpO2 100%   Physical Exam Vitals and nursing note reviewed.  Constitutional:      General: She is not in acute distress.    Appearance: She is well-developed.  HENT:     Head: Normocephalic and atraumatic.     Mouth/Throat:     Pharynx: No oropharyngeal exudate.  Eyes:     General: No scleral icterus.       Right eye: No discharge.        Left eye: No discharge.     Conjunctiva/sclera: Conjunctivae normal.     Pupils:  Pupils are equal, round, and reactive to light.  Neck:     Thyroid: No thyromegaly.     Vascular: No JVD.  Cardiovascular:     Rate and Rhythm: Normal rate and regular rhythm.     Heart sounds: Normal heart sounds. No murmur heard.  No friction rub. No gallop.   Pulmonary:     Effort: Pulmonary effort is normal. No respiratory distress.     Breath sounds: Normal breath sounds. No wheezing or rales.  Abdominal:     General: Bowel sounds are normal. There is no distension.     Palpations: Abdomen is soft. There is no mass.     Tenderness: There is no abdominal tenderness.  Genitourinary:    Comments: Chaperone present for exam, patient has normal-appearing external anal structures, she has a small fissure at approximately 7:00, is actively bleeding, there is no hemorrhoids either seen or palpated, internal exam  without any hard or formed stool, small amount of brown mucoid material.  Well-tolerated Musculoskeletal:        General: No tenderness. Normal range of motion.     Cervical back: Normal range of motion and neck supple.  Lymphadenopathy:     Cervical: No cervical adenopathy.  Skin:    General: Skin is warm and dry.     Findings: No erythema or rash.  Neurological:     Mental Status: She is alert.     Coordination: Coordination normal.  Psychiatric:        Behavior: Behavior normal.   Chaperone present for rectal exam for the physician assistant student Vassie Moselle and the RN  ED Results / Procedures / Treatments   Labs (all labs ordered are listed, but only abnormal results are displayed) Labs Reviewed  COMPREHENSIVE METABOLIC PANEL  CBC  I-STAT BETA HCG BLOOD, ED ([facility], [facility], [facility] ONLY)  POC OCCULT BLOOD, ED  TYPE AND SCREEN    EKG None  Radiology No results found.  Procedures Procedures (including critical care time)  Medications Ordered in ED Medications - No data to display  ED Course  I have reviewed the triage vital signs and the nursing notes.  Pertinent labs & imaging results that were available during my care of the patient were reviewed by me and considered in my medical decision making (see chart for details).    MDM Rules/Calculators/A&P                          The patient had a very benign exam, normal vital signs, doubt that she is anemic, the patient will need to be placed on stool softeners that she likely has just a small failure.  This was likely caused by the constipation, I do not think she has diverticulosis or any other severe or worsening condition.  She has been given the precautions for return, stable for discharge assuming that her CBC comes back normal.  Final Clinical Impression(s) / ED Diagnoses Final diagnoses:  Anal fissure  Rectal bleeding    Rx / DC Orders ED Discharge Orders    None       Eber Hong, MD 12/09/19 2109

## 2019-12-09 NOTE — ED Notes (Signed)
Pt called in lobby parking lot and restroom no answer

## 2019-12-09 NOTE — ED Provider Notes (Signed)
° °  Took patient care handoff report from Eber Hong, MD. Dr. Hyacinth Meeker saw this patient in triage, performed her interview and exam, and discussed discharge instructions if labs results were favorable.  Abnormal Labs Reviewed  POC OCCULT BLOOD, ED - Abnormal; Notable for the following components:      Result Value   Fecal Occult Bld POSITIVE (*)    All other components within normal limits    Patient's lab work was overall unremarkable.  Hemoccult was positive, however, this is to be expected given the patient's complaint and discovery of fissure on exam.  Vitals:   12/09/19 2046  BP: 117/83  Pulse: 72  Resp: 18  Temp: 98.3 F (36.8 C)  TempSrc: Oral  SpO2: 100%      Nicole Lamb 12/09/19 2245    Eber Hong, MD 12/10/19 2013

## 2019-12-09 NOTE — Discharge Instructions (Signed)
Your shows that you have a fissure.  This is a small tear in the skin of your anus, it occurs when you have a hard stool or if he put any foreign bodies in the rectum.  Please take a stool softener, this will dissolve the stool since that stress of the tissue.  I would recommend that you take MiraLAX twice a day until you are having regular loose or watery stools, then back off until once a day, if that still works then go to every other day.  For severe or worsening bleeding return to the emergency department

## 2019-12-09 NOTE — ED Triage Notes (Signed)
Pt reports bright red blood with every bowel movement. Reports she was diagnosed with diverticulitis x 2 weeks ago. Denies diarrhea and abdominal pain at this time.

## 2020-01-23 ENCOUNTER — Encounter: Payer: Self-pay | Admitting: Gastroenterology

## 2020-01-23 ENCOUNTER — Ambulatory Visit: Payer: BC Managed Care – PPO | Admitting: Gastroenterology

## 2020-01-23 DIAGNOSIS — K625 Hemorrhage of anus and rectum: Secondary | ICD-10-CM | POA: Diagnosis not present

## 2020-01-23 DIAGNOSIS — R1084 Generalized abdominal pain: Secondary | ICD-10-CM | POA: Diagnosis not present

## 2020-01-23 MED ORDER — SUPREP BOWEL PREP KIT 17.5-3.13-1.6 GM/177ML PO SOLN: 1.0000 | ORAL | 0 refills | Status: DC

## 2020-01-23 NOTE — Patient Instructions (Signed)
If you are age 32 or older, your body mass index should be between 23-30. Your Body mass index is 31.64 kg/m. If this is out of the aforementioned range listed, please consider follow up with your Primary Care Provider.  If you are age 28 or younger, your body mass index should be between 19-25. Your Body mass index is 31.64 kg/m. If this is out of the aformentioned range listed, please consider follow up with your Primary Care Provider.   You have been scheduled for a colonoscopy. Please follow written instructions given to you at your visit today.  Please pick up your prep supplies at the pharmacy within the next 1-3 days. If you use inhalers (even only as needed), please bring them with you on the day of your procedure.  Due to recent changes in healthcare laws, you may see the results of your imaging and laboratory studies on MyChart before your provider has had a chance to review them.  We understand that in some cases there may be results that are confusing or concerning to you. Not all laboratory results come back in the same time frame and the provider may be waiting for multiple results in order to interpret others.  Please give [place] 48 hours in order for your provider to thoroughly review all the results before contacting the office for clarification of your results.   Thank you for entrusting me with your care and choosing [facility]  Dr Christella Hartigan

## 2020-01-23 NOTE — Progress Notes (Signed)
HPI: This is a very pleasant 34 year old woman who was referred to me by Waynard Reeds, MD  to evaluate rectal bleeding, generalized abdominal pain, abnormal colon on recent CT scans.    She has always tended to have mild constipation.  She would generally move her bowels 2-3 times per week.  Very little pushing or straining however.  In 2018 she had significant bloody diarrhea presented to emergency room.  She was told that she had colitis.  She never found out why this happened to her but her symptoms improve relatively quickly after antibiotics.  About a month and a half ago her mild chronic constipation became much worse with significantly hard to move stools, she was pushing and straining quite a bit to move her bowels and then had a tearing sensation in her anus and had some bleeding.  She went to the emergency room was found to have a small anal fissure after for 5 days of bleeding.  She was again told that she probably had colitis.  She started taking MiraLAX on a daily basis and then transition to Metamucil and since taking the Metamucil she is having a bowel movement just about every day.  No straining.  Colon cancer does not run in her family.  A grandparent of hers has had colitis.  She has probably gained 15 pounds in the past year or so.  She has mild lower abdominal discomforts prior to bowel movement and they generally improve when she moves her bowels.  The bleeding has stopped.  Old Data Reviewed: CT scan abdomen pelvis with IV and oral contrast August 2018: 1. Diffuse colon wall thickening and thickening at the ileocecal valve suspicious for colitis which may be secondary to infection or inflammatory bowel disease. 2. Small amount of free fluid in the pelvis   CT scan abdomen pelvis with IV and oral contrast August 2021:  1. Underdistention of the colon versus mild colitis. Clinical correlation is recommended. No bowel obstruction. Normal appendix. 2. Slight heterogeneous  enhancement of the right kidney. Correlation with urinalysis recommended to evaluate for possibility of Pyelonephritis.   Blood tests September and August 2021 CBCs were normal, complete metabolic profiles were normal, FOB testing positive.  She was in the emergency room September 2021 with some rectal bleeding.  She was found on examination to have a small anal fissure was actively bleeding.  They felt that this is probably from her recent constipation.    Review of systems: Pertinent positive and negative review of systems were noted in the above HPI section. All other review negative.   Past Medical History:  Diagnosis Date   Medical history non-contributory     Past Surgical History:  Procedure Laterality Date   NO PAST SURGERIES      Current Outpatient Medications  Medication Sig Dispense Refill   acetaminophen (TYLENOL) 500 MG tablet Take 1,000 mg by mouth every 6 (six) hours as needed for mild pain, moderate pain or headache.     meloxicam (MOBIC) 7.5 MG tablet Take 7.5 mg by mouth daily as needed.     psyllium (METAMUCIL) 58.6 % packet Take 1 packet by mouth daily.     No current facility-administered medications for this visit.    Allergies as of 01/23/2020 - Review Complete 01/23/2020  Allergen Reaction Noted   Bee venom Swelling 06/20/2012   Shellfish allergy Swelling 06/20/2012   Povidone-iodine  03/17/2016    Family History  Problem Relation Age of Onset   Diabetes Mother  Hypertension Father     Social History   Socioeconomic History   Marital status: Single    Spouse name: Not on file   Number of children: Not on file   Years of education: Not on file   Highest education level: Not on file  Occupational History   Not on file  Tobacco Use   Smoking status: Never Smoker   Smokeless tobacco: Never Used  Vaping Use   Vaping Use: Never used  Substance and Sexual Activity   Alcohol use: Yes    Alcohol/week: 7.0 standard  drinks    Types: 7 Glasses of wine per week    Comment: 1 glass of wine per night   Drug use: No   Sexual activity: Yes    Birth control/protection: Pill  Other Topics Concern   Not on file  Social History Narrative   Not on file   Social Determinants of Health   Financial Resource Strain:    Difficulty of Paying Living Expenses: Not on file  Food Insecurity:    Worried About Programme researcher, broadcasting/film/video in the Last Year: Not on file   [employer] of Food in the Last Year: Not on file  Transportation Needs:    Lack of Transportation (Medical): Not on file   Lack of Transportation (Non-Medical): Not on file  Physical Activity:    Days of Exercise per Week: Not on file   Minutes of Exercise per Session: Not on file  Stress:    Feeling of Stress : Not on file  Social Connections:    Frequency of Communication with Friends and Family: Not on file   Frequency of Social Gatherings with Friends and Family: Not on file   Attends Religious Services: Not on file   Active Member of Clubs or Organizations: Not on file   Attends Banker Meetings: Not on file   Marital Status: Not on file  Intimate Partner Violence:    Fear of Current or Ex-Partner: Not on file   Emotionally Abused: Not on file   Physically Abused: Not on file   Sexually Abused: Not on file     Physical Exam: BP 96/62 (BP Location: Left Arm, Patient Position: Sitting, Cuff Size: Normal)    Pulse 80    Ht 5\' 5"  (1.651 m)    Wt 190 lb 2 oz (86.2 kg)    BMI 31.64 kg/m  Constitutional: generally well-appearing Psychiatric: alert and oriented x3 Eyes: extraocular movements intact Mouth: oral pharynx moist, no lesions Neck: supple no lymphadenopathy Cardiovascular: heart regular rate and rhythm Lungs: clear to auscultation bilaterally Abdomen: soft, mildly tender throughout her abdomen, nondistended, no obvious ascites, no peritoneal signs, normal bowel sounds Extremities: no lower extremity  edema bilaterally Skin: no lesions on visible extremities Rectal exam deferred for upcoming colonoscopy   Assessment and plan: 34 y.o. female with minor rectal bleeding, abnormal colon on 2018 CT scan  I recommended we evaluate her minor rectal bleeding a little bit more.  She did have very abnormal: 2018 perhaps she has mild chronic ulcerative colitis.  I think it is very likely that she has an underlying neoplasm.  I see no reason for any further blood tests or imaging studies but I did recommend we go ahead with a colonoscopy at her soonest convenience.  Please see the "Patient Instructions" section for addition details about the plan.   2019, MD [facility] 01/23/2020, 2:33 PM  Cc: 01/25/2020, MD  Total time on  date of encounter was 40  minutes (this included time spent preparing to see the patient reviewing records; obtaining and/or reviewing separately obtained history; performing a medically appropriate exam and/or evaluation; counseling and educating the patient and family if present; ordering medications, tests or procedures if applicable; and documenting clinical information in the health record).

## 2020-01-25 ENCOUNTER — Other Ambulatory Visit: Payer: Self-pay | Admitting: Gastroenterology

## 2020-01-25 LAB — SARS CORONAVIRUS 2 (TAT 6-24 HRS): SARS Coronavirus 2: NEGATIVE

## 2020-01-29 ENCOUNTER — Ambulatory Visit (AMBULATORY_SURGERY_CENTER): Payer: BC Managed Care – PPO | Admitting: Gastroenterology

## 2020-01-29 ENCOUNTER — Other Ambulatory Visit: Payer: Self-pay

## 2020-01-29 ENCOUNTER — Telehealth: Payer: Self-pay | Admitting: Gastroenterology

## 2020-01-29 ENCOUNTER — Encounter: Payer: Self-pay | Admitting: Gastroenterology

## 2020-01-29 VITALS — BP 115/73 | HR 59 | Temp 98.1°F | Resp 15 | Ht 65.0 in | Wt 190.0 lb

## 2020-01-29 DIAGNOSIS — K625 Hemorrhage of anus and rectum: Secondary | ICD-10-CM | POA: Diagnosis present

## 2020-01-29 DIAGNOSIS — R1084 Generalized abdominal pain: Secondary | ICD-10-CM

## 2020-01-29 MED ORDER — SODIUM CHLORIDE 0.9 % IV SOLN: 500.0000 mL | INTRAVENOUS | Status: DC

## 2020-01-29 NOTE — Op Note (Signed)
[facility] Patient Name: Nicole Lamb Procedure Date: 01/29/2020 3:08 PM MRN: [id number] Endoscopist: Rachael Fee , MD Age: 34 Referring MD:  Date of Birth: 1985-08-23 Gender: Female Account #: [id number] Procedure:                Colonoscopy Indications:              Hematochezia; abnormal 2018 CT scan of the colon Medicines:                Monitored Anesthesia Care Procedure:                Pre-Anesthesia Assessment:                           - Prior to the procedure, a History and Physical                            was performed, and patient medications and                            allergies were reviewed. The patient's tolerance of                            previous anesthesia was also reviewed. The risks                            and benefits of the procedure and the sedation                            options and risks were discussed with the patient.                            All questions were answered, and informed consent                            was obtained. Prior Anticoagulants: The patient has                            taken no previous anticoagulant or antiplatelet                            agents. ASA Grade Assessment: II - A patient with                            mild systemic disease. After reviewing the risks                            and benefits, the patient was deemed in                            satisfactory condition to undergo the procedure.                           After obtaining informed consent, the colonoscope  was passed under direct vision. Throughout the                            procedure, the patient's blood pressure, pulse, and                            oxygen saturations were monitored continuously. The                            Colonoscope was introduced through the anus and                            advanced to the the cecum, identified by                            appendiceal orifice  and ileocecal valve. The                            colonoscopy was performed without difficulty. The                            patient tolerated the procedure well. The quality                            of the bowel preparation was good. The terminal                            ileum, ileocecal valve, appendiceal orifice, and                            rectum were photographed. Scope In: 3:21:29 PM Scope Out: 3:33:45 PM Scope Withdrawal Time: 0 hours 8 minutes 44 seconds  Total Procedure Duration: 0 hours 12 minutes 16 seconds  Findings:                 The entire examined colon appeared normal on direct                            and retroflexion views. Complications:            No immediate complications. Estimated blood loss:                            None. Estimated Blood Loss:     Estimated blood loss: none. Impression:               - The entire examined colon is normal on direct and                            retroflexion views.                           - No polyps ro cancers. Recommendation:           - Patient has a contact number available for  emergencies. The signs and symptoms of potential                            delayed complications were discussed with the                            patient. Return to normal activities tomorrow.                            Written discharge instructions were provided to the                            patient.                           - Resume previous diet.                           - Continue present medications.                           - Repeat colonoscopy at age 73 for screening. Rachael Fee, MD 01/29/2020 3:44:28 PM This report has been signed electronically.

## 2020-01-29 NOTE — Telephone Encounter (Signed)
Pt is requesting a call back from a nurse to discuss the prep for her procedure scheduled today at 3:30pm

## 2020-01-29 NOTE — Telephone Encounter (Signed)
Patient returning phone call. Please call.

## 2020-01-29 NOTE — Telephone Encounter (Signed)
No answer, left message to call back

## 2020-01-29 NOTE — Progress Notes (Signed)
Pt's states no medical or surgical changes since previsit or office visit. 

## 2020-01-29 NOTE — Patient Instructions (Signed)
You may resume your previous diet and medication schedule.  Thank you for allowing us to care for you today!!!   YOU HAD AN ENDOSCOPIC PROCEDURE TODAY AT THE [facility]:   Refer to the procedure report that was given to you for any specific questions about what was found during the examination.  If the procedure report does not answer your questions, please call your gastroenterologist to clarify.  If you requested that your care partner not be given the details of your procedure findings, then the procedure report has been included in a sealed envelope for you to review at your convenience later.  YOU SHOULD EXPECT: Some feelings of bloating in the abdomen. Passage of more gas than usual.  Walking can help get rid of the air that was put into your GI tract during the procedure and reduce the bloating. If you had a lower endoscopy (such as a colonoscopy or flexible sigmoidoscopy) you may notice spotting of blood in your stool or on the toilet paper. If you underwent a bowel prep for your procedure, you may not have a normal bowel movement for a few days.  Please Note:  You might notice some irritation and congestion in your nose or some drainage.  This is from the oxygen used during your procedure.  There is no need for concern and it should clear up in a day or so.  SYMPTOMS TO REPORT IMMEDIATELY:   Following lower endoscopy (colonoscopy or flexible sigmoidoscopy):  Excessive amounts of blood in the stool  Significant tenderness or worsening of abdominal pains  Swelling of the abdomen that is new, acute  Fever of 100F or higher  For urgent or emergent issues, a gastroenterologist can be reached at any hour by calling [phone]. Do not use MyChart messaging for urgent concerns.    DIET:  We do recommend a small meal at first, but then you may proceed to your regular diet.  Drink plenty of fluids but you should avoid alcoholic beverages for 24 hours.  ACTIVITY:  You  should plan to take it easy for the rest of today and you should NOT DRIVE or use heavy machinery until tomorrow (because of the sedation medicines used during the test).    FOLLOW UP: Our staff will call the number listed on your records Thursday morning between 7:15 am and 8:15 am to check on you and address any questions or concerns that you may have regarding the information given to you following your procedure. If we do not reach you, we will leave a message.  We will attempt to reach you two times.  During this call, we will ask if you have developed any symptoms of COVID 19. If you develop any symptoms (ie: fever, flu-like symptoms, shortness of breath, cough etc.) before then, please call [phone].  If you test positive for Covid 19 in the 2 weeks post procedure, please call and report this information to us.    If any biopsies were taken you will be contacted by phone or by letter within the next 1-3 weeks.  Please call us at [phone] if you have not heard about the biopsies in 3 weeks.    SIGNATURES/CONFIDENTIALITY: You and/or your care partner have signed paperwork which will be entered into your electronic medical record.  These signatures attest to the fact that that the information above on your After Visit Summary has been reviewed and is understood.  Full responsibility of the confidentiality of this discharge information   lies with you and/or your care-partner.

## 2020-01-29 NOTE — Telephone Encounter (Signed)
All questions answered

## 2020-01-29 NOTE — Progress Notes (Signed)
PT taken to PACU. Monitors in place. VSS. Report given to RN. 

## 2020-01-31 ENCOUNTER — Telehealth: Payer: Self-pay

## 2020-01-31 NOTE — Telephone Encounter (Signed)
  Follow up Call-  Call back number 01/29/2020  Post procedure Call Back phone  # [phone]  Permission to leave phone message Yes  Some recent data might be hidden     Patient questions:  Do you have a fever, pain , or abdominal swelling? No. Pain Score  0 *  Have you tolerated food without any problems? Yes.    Have you been able to return to your normal activities? Yes.    Do you have any questions about your discharge instructions: Diet   No. Medications  No. Follow up visit  No.  Do you have questions or concerns about your Care? No.  Actions: * If pain score is 4 or above: No action needed, pain <4.  1. Have you developed a fever since your procedure? no  2.   Have you had an respiratory symptoms (SOB or cough) since your procedure? no  3.   Have you tested positive for COVID 19 since your procedure no  4.   Have you had any family members/close contacts diagnosed with the COVID 19 since your procedure?  no   If yes to any of these questions please route to Laverna Peace, RN and Karlton Lemon, RN

## 2022-02-04 IMAGING — CT CT ABD-PELV W/ CM
2 of 4 series · 16 of 46 positions shown, 18 images · IV contrast (omnipaque)
Comparison: CT abdomen pelvis dated 11/21/2016.

CLINICAL DATA: 35-year-old female with acute abdominal pain.

EXAM:
CT ABDOMEN AND PELVIS WITH CONTRAST
TECHNIQUE: Multidetector CT imaging of the abdomen and pelvis was performed
using the standard protocol following bolus administration of
intravenous contrast.
CONTRAST:  100mL OMNIPAQUE IOHEXOL 300 MG/ML  SOLN

[Series 2: axial st · axial · 0.73mm/px · z∈[-463,-18]mm · 13 of 99 slices shown, 15 images]
[im 5/99  soft-tissue]
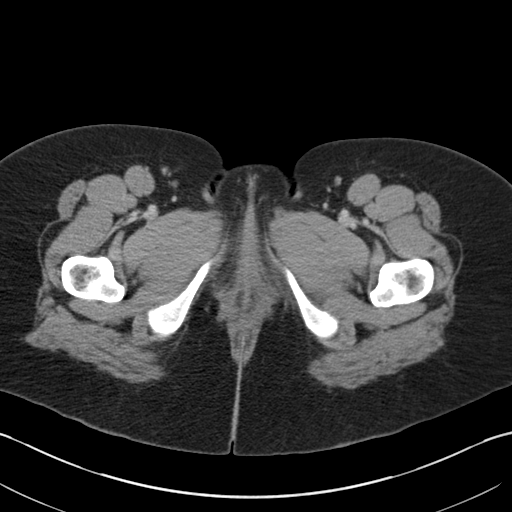
[im 5/99  bone]
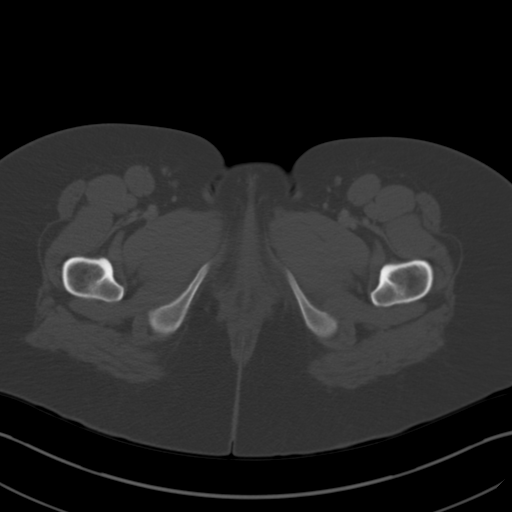
[im 15/99  soft-tissue]
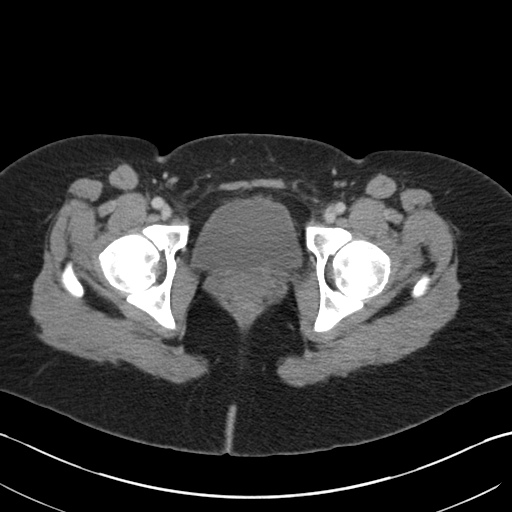
[im 20/99  soft-tissue]
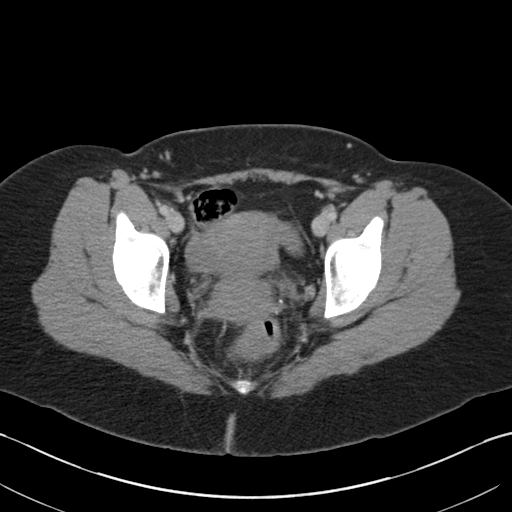
[im 30/99  soft-tissue]
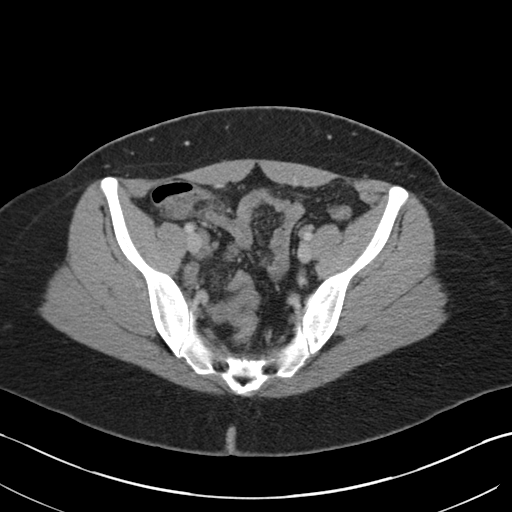
[im 35/99  soft-tissue]
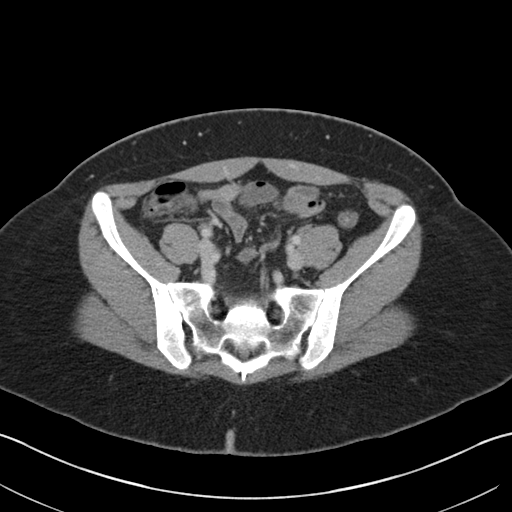
[im 45/99  soft-tissue]
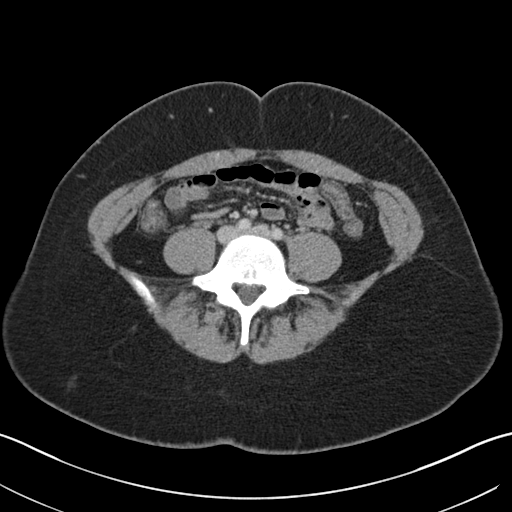
[im 50/99  soft-tissue]
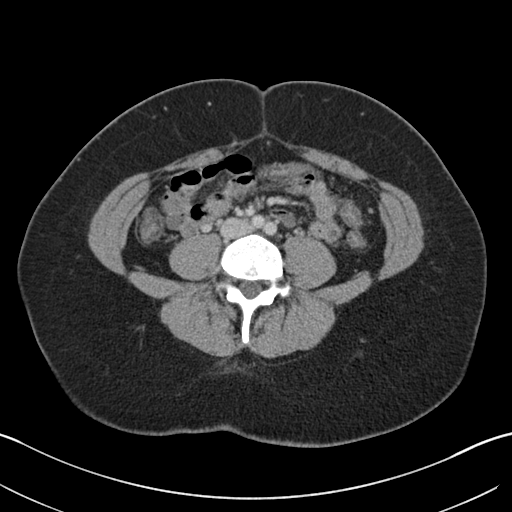
[im 54/99  soft-tissue]
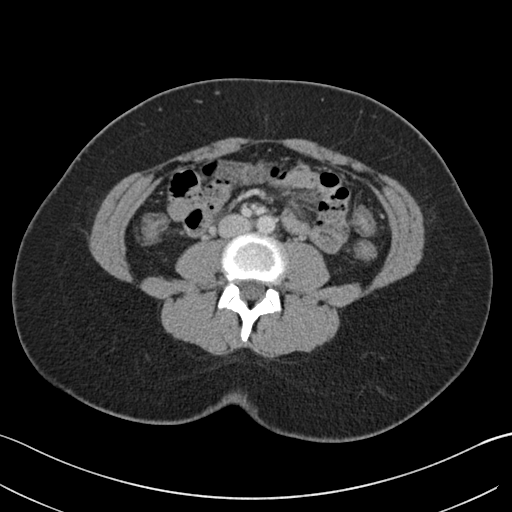
[im 64/99  soft-tissue]
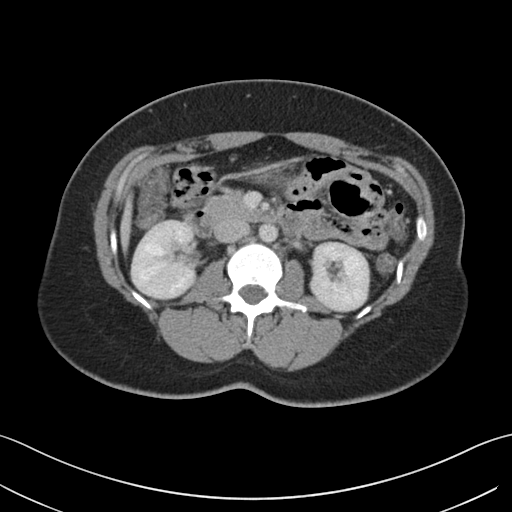
[im 64/99  bone]
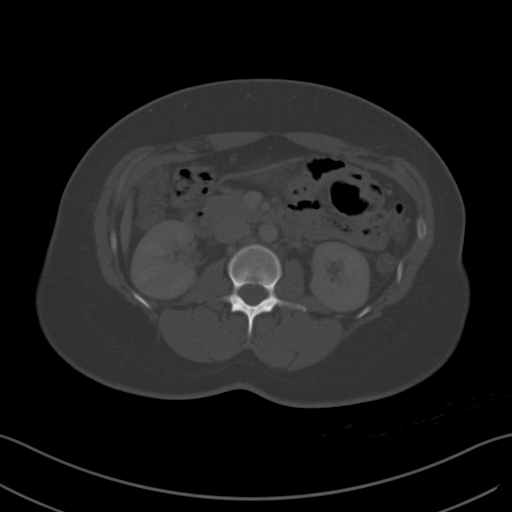
[im 69/99  soft-tissue]
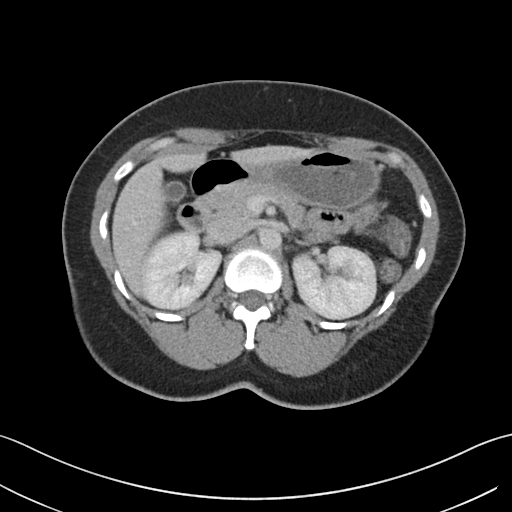
[im 79/99  soft-tissue]
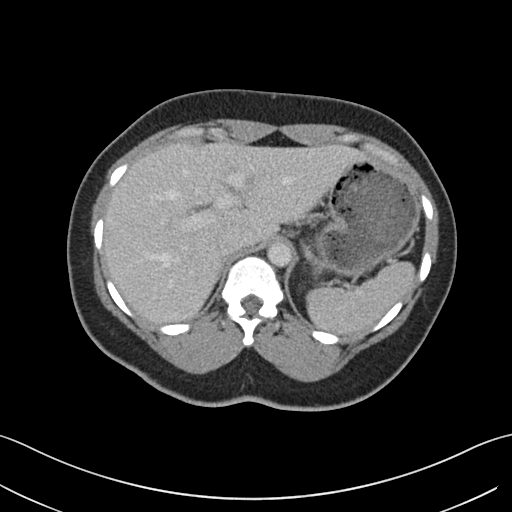
[im 84/99  soft-tissue]
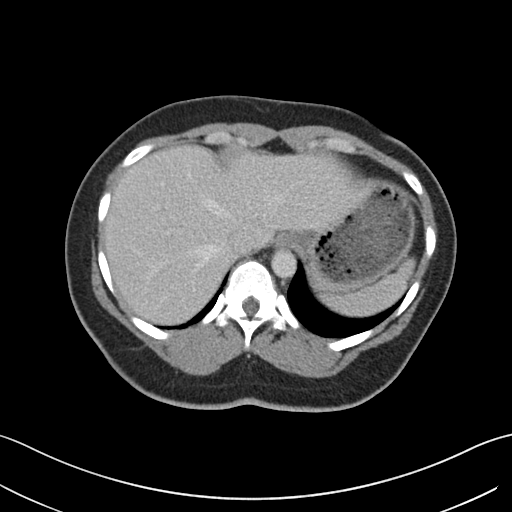
[im 94/99  soft-tissue]
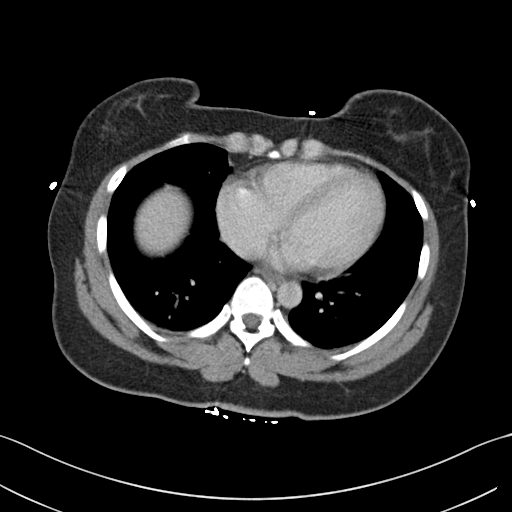

[Series 5: coronal st · coronal · 0.79mm/px · 3 of 140 slices shown]
[im 47/140  soft-tissue]
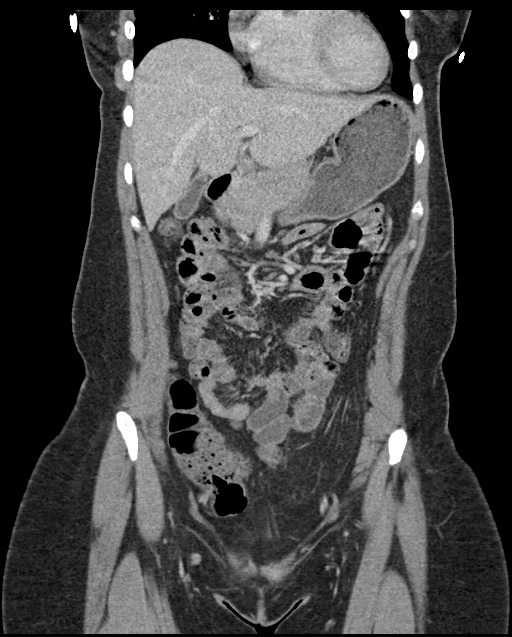
[im 62/140  soft-tissue]
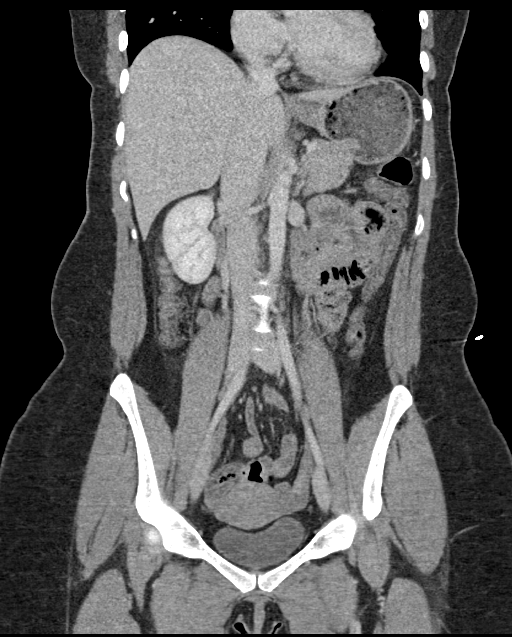
[im 78/140  soft-tissue]
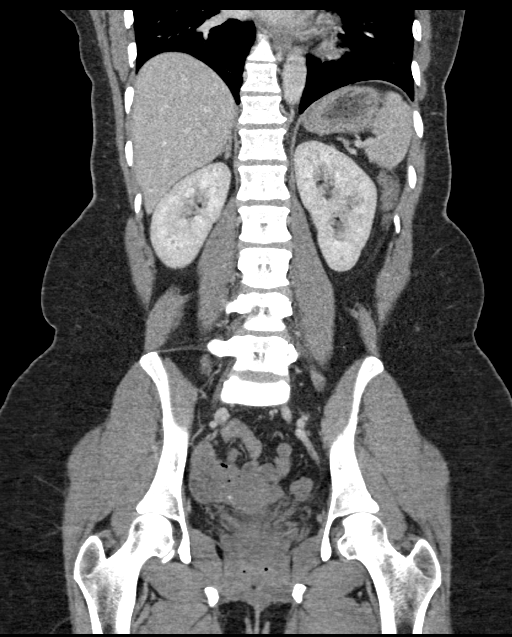

[16 of 46 positions shown; findings below may reference images not displayed]

FINDINGS: Lower chest: The visualized lung bases are clear.

No intra-abdominal free air.  Trace free fluid in the pelvis.

Hepatobiliary: No focal liver abnormality is seen. No gallstones,
gallbladder wall thickening, or biliary dilatation.

Pancreas: Unremarkable. No pancreatic ductal dilatation or
surrounding inflammatory changes.

Spleen: Normal in size without focal abnormality.

Adrenals/Urinary Tract: The adrenal glands are unremarkable. There
is no hydronephrosis on either side. There is apparent slight
heterogeneous enhancement of the right kidney. Correlation with
urinalysis recommended to evaluate for possibility of
pyelonephritis. The visualized ureters and urinary bladder appear
unremarkable.

Stomach/Bowel: Diffusely thickened appearance of the colon may be
related to underdistention or represent mild colitis. Clinical
correlation is recommended. There is no bowel obstruction. The
appendix is normal.

Vascular/Lymphatic: The abdominal aorta and IVC are unremarkable. No
portal venous gas. There is no adenopathy.

Reproductive: The uterus is anteverted.  No adnexal masses.

Other: None

Musculoskeletal: No acute or significant osseous findings.
IMPRESSION: 1. Underdistention of the colon versus mild colitis. Clinical
correlation is recommended. No bowel obstruction. Normal appendix.
2. Slight heterogeneous enhancement of the right kidney. Correlation
with urinalysis recommended to evaluate for possibility of
pyelonephritis.

## 2023-05-11 ENCOUNTER — Emergency Department (HOSPITAL_BASED_OUTPATIENT_CLINIC_OR_DEPARTMENT_OTHER)
Admission: EM | Admit: 2023-05-11 | Discharge: 2023-05-12 | Discharge status: Against Medical Advice | Payer: No Typology Code available for payment source | Attending: Emergency Medicine | Admitting: Emergency Medicine

## 2023-05-11 ENCOUNTER — Encounter (HOSPITAL_BASED_OUTPATIENT_CLINIC_OR_DEPARTMENT_OTHER): Payer: Self-pay | Admitting: Emergency Medicine

## 2023-05-11 DIAGNOSIS — R059 Cough, unspecified: Secondary | ICD-10-CM | POA: Diagnosis present

## 2023-05-11 DIAGNOSIS — R519 Headache, unspecified: Secondary | ICD-10-CM | POA: Diagnosis present

## 2023-05-11 DIAGNOSIS — R0981 Nasal congestion: Secondary | ICD-10-CM | POA: Insufficient documentation

## 2023-05-11 DIAGNOSIS — M791 Myalgia, unspecified site: Secondary | ICD-10-CM | POA: Insufficient documentation

## 2023-05-11 DIAGNOSIS — Z5321 Procedure and treatment not carried out due to patient leaving prior to being seen by health care provider: Secondary | ICD-10-CM | POA: Insufficient documentation

## 2023-05-11 DIAGNOSIS — R6883 Chills (without fever): Secondary | ICD-10-CM | POA: Diagnosis not present

## 2023-05-11 DIAGNOSIS — Z1152 Encounter for screening for COVID-19: Secondary | ICD-10-CM | POA: Insufficient documentation

## 2023-05-11 LAB — RESP PANEL BY RT-PCR (RSV, FLU A&B, COVID)  RVPGX2
Influenza A by PCR: NEGATIVE
Influenza B by PCR: NEGATIVE
Resp Syncytial Virus by PCR: NEGATIVE
SARS Coronavirus 2 by RT PCR: NEGATIVE

## 2023-05-11 NOTE — ED Triage Notes (Signed)
 Flu- like symptoms since Sunday.cough congestion body aches chills Headache worse today notice BP high at home (unsure the number) (87/124??per patient )

## 2023-05-12 ENCOUNTER — Encounter (HOSPITAL_BASED_OUTPATIENT_CLINIC_OR_DEPARTMENT_OTHER): Payer: Self-pay | Admitting: Emergency Medicine

## 2023-05-12 ENCOUNTER — Emergency Department (HOSPITAL_BASED_OUTPATIENT_CLINIC_OR_DEPARTMENT_OTHER)
Admission: EM | Admit: 2023-05-12 | Discharge: 2023-05-12 | Disposition: A | Discharge status: Home / Self Care | Payer: No Typology Code available for payment source | Source: Home / Self Care | Attending: Emergency Medicine | Admitting: Emergency Medicine

## 2023-05-12 ENCOUNTER — Other Ambulatory Visit: Payer: Self-pay

## 2023-05-12 DIAGNOSIS — R519 Headache, unspecified: Secondary | ICD-10-CM | POA: Insufficient documentation

## 2023-05-12 MED ORDER — PROCHLORPERAZINE EDISYLATE 10 MG/2ML IJ SOLN
10.0000 mg | Freq: Once | INTRAMUSCULAR | Status: AC
  Administered 2023-05-12: 10 mg via INTRAMUSCULAR
  Filled 2023-05-12: qty 2

## 2023-05-12 MED ORDER — KETOROLAC TROMETHAMINE 15 MG/ML IJ SOLN
15.0000 mg | Freq: Once | INTRAMUSCULAR | Status: AC
  Administered 2023-05-12: 15 mg via INTRAMUSCULAR
  Filled 2023-05-12: qty 1

## 2023-05-12 MED ORDER — DIPHENHYDRAMINE HCL 25 MG PO CAPS
25.0000 mg | ORAL_CAPSULE | Freq: Once | ORAL | Status: AC
  Administered 2023-05-12: 25 mg via ORAL
  Filled 2023-05-12: qty 1

## 2023-05-12 NOTE — ED Triage Notes (Signed)
 C/o headache and flu like symptoms since Sunday. Here yesterday but LWBS. Negative swabs.

## 2023-05-12 NOTE — ED Provider Notes (Signed)
 [place] EM[facility]GENCY DEPARTMENT AT [facility] Provider Note   CSN: [id number] Arrival date & time: 05/12/23  1309     History  Chief Complaint  Patient presents with   Headache    LOGHAN SUBIA is a 38 y.o. female.  38 year old female presents today for concern of headache.  She states over the past week she has has had flulike symptoms which have resolved but now she is left with a headache.  She states she does have history of migraine headaches but typically they improved with over-the-counter headaches.  This was a gradual onset headache.  Not the worst headache of her life.  She has been taking over-the-counter medicines without significant improvement.  The history is provided by the patient. No language interpreter was used.       Home Medications Prior to Admission medications   Medication Sig Start Date End Date Taking? Authorizing Provider  acetaminophen  (TYLENOL ) 500 MG tablet Take 1,000 mg by mouth every 6 (six) hours as needed for mild pain, moderate pain or headache.    [provider]  meloxicam (MOBIC) 7.5 MG tablet Take 7.5 mg by mouth daily as needed. 12/31/19   [provider]  psyllium (METAMUCIL) 58.6 % packet Take 1 packet by mouth daily.    [provider]  dicyclomine  (BENTYL ) 20 MG tablet Take 1 tablet (20 mg total) by mouth 2 (two) times daily. 11/25/19 12/09/19  Vonn Hadassah LABOR, PA-C      Allergies    Bee venom, Shellfish allergy, and Povidone-iodine    Review of Systems   Review of Systems  Constitutional:  Negative for activity change, chills and fever.  Eyes:  Positive for photophobia. Negative for visual disturbance.  Musculoskeletal:  Negative for neck pain.  Neurological:  Positive for headaches. Negative for dizziness, speech difficulty, weakness, light-headedness and numbness.  All other systems reviewed and are negative.   Physical Exam Updated Vital Signs BP 124/82   Pulse 77   Temp 98.4 F (36.9 C)  (Oral)   Resp 20   LMP  (Approximate)   SpO2 100%  Physical Exam Vitals and nursing note reviewed.  Constitutional:      General: She is not in acute distress.    Appearance: Normal appearance. She is not ill-appearing.  HENT:     Head: Normocephalic and atraumatic.     Nose: Nose normal.  Eyes:     Conjunctiva/sclera: Conjunctivae normal.  Cardiovascular:     Rate and Rhythm: Normal rate and regular rhythm.  Pulmonary:     Effort: Pulmonary effort is normal. No respiratory distress.  Musculoskeletal:        General: No deformity. Normal range of motion.     Cervical back: Normal range of motion.  Skin:    Findings: No rash.  Neurological:     General: No focal deficit present.     Mental Status: She is alert and oriented to person, place, and time. Mental status is at baseline.     Cranial Nerves: No cranial nerve deficit.     Motor: No weakness.     ED Results / Procedures / Treatments   Labs (all labs ordered are listed, but only abnormal results are displayed) Labs Reviewed - No data to display  EKG None  Radiology No results found.  Procedures Procedures    Medications Ordered in ED Medications  ketorolac  (TORADOL ) 15 MG/ML injection 15 mg (15 mg Intramuscular Given 05/12/23 2016)  prochlorperazine  (COMPAZINE ) injection 10 mg (  10 mg Intramuscular Given 05/12/23 2016)  diphenhydrAMINE  (BENADRYL ) capsule 25 mg (25 mg Oral Given 05/12/23 2016)    ED Course/ Medical Decision Making/ A&P                                 Medical Decision Making Risk Prescription drug management.   38 year old female presents today for concern of headache.  She has tried over-the-counter medicines without significant improvement.  This is not a sudden onset or the worst headache of her life.  This was gradual onset.  Flulike symptoms resolved.  Migraine cocktail given in the emergency department with improvement in symptoms.  Patient is appropriate for discharge.  Discharged in  stable condition.  Return precaution discussed.  Patient voices understanding and is in agreement with plan.   Final Clinical Impression(s) / ED Diagnoses Final diagnoses:  Bad headache    Rx / DC Orders ED Discharge Orders     None         Hildegard Loge, PA-C 05/12/23 2111    Lenor Hollering, MD 05/13/23 1222

## 2023-05-12 NOTE — Discharge Instructions (Signed)
 Your exam today was reassuring.  You had improvement in your headache with medications in the emergency department.  Continue taking your home medications for headache including Tylenol , ibuprofen , and Excedrin.  Follow-up with your primary care doctor.  Return for any emergent symptoms.

## 2023-05-12 NOTE — ED Notes (Signed)
 RN reviewed discharge instructions with pt. Pt verbalized understanding and had no further questions. VSS upon discharge.

## 2023-06-13 ENCOUNTER — Encounter (HOSPITAL_BASED_OUTPATIENT_CLINIC_OR_DEPARTMENT_OTHER): Payer: Self-pay | Admitting: Emergency Medicine

## 2023-06-13 ENCOUNTER — Other Ambulatory Visit: Payer: Self-pay

## 2023-06-13 ENCOUNTER — Ambulatory Visit: Admission: EM | Admit: 2023-06-13 | Discharge: 2023-06-13 | Disposition: A | Discharge status: Home / Self Care

## 2023-06-13 ENCOUNTER — Emergency Department (HOSPITAL_BASED_OUTPATIENT_CLINIC_OR_DEPARTMENT_OTHER)
Admission: EM | Admit: 2023-06-13 | Discharge: 2023-06-13 | Disposition: A | Discharge status: Home / Self Care | Attending: Emergency Medicine | Admitting: Emergency Medicine

## 2023-06-13 DIAGNOSIS — L509 Urticaria, unspecified: Secondary | ICD-10-CM | POA: Diagnosis present

## 2023-06-13 DIAGNOSIS — N760 Acute vaginitis: Secondary | ICD-10-CM | POA: Insufficient documentation

## 2023-06-13 MED ORDER — DIPHENHYDRAMINE HCL 25 MG PO CAPS
50.0000 mg | ORAL_CAPSULE | Freq: Once | ORAL | Status: AC
  Administered 2023-06-13: 50 mg via ORAL
  Filled 2023-06-13: qty 2

## 2023-06-13 MED ORDER — METHYLPREDNISOLONE ACETATE 80 MG/ML IJ SUSP
80.0000 mg | Freq: Once | INTRAMUSCULAR | Status: AC
  Administered 2023-06-13: 80 mg via INTRAMUSCULAR

## 2023-06-13 MED ORDER — EPINEPHRINE 0.3 MG/0.3ML IJ SOAJ
0.3000 mg | INTRAMUSCULAR | 0 refills | Status: AC | PRN
Start: 2023-06-13 — End: ?

## 2023-06-13 MED ORDER — PREDNISONE 10 MG (21) PO TBPK: ORAL_TABLET | Freq: Every day | ORAL | 0 refills | Status: AC

## 2023-06-13 MED ORDER — DIPHENHYDRAMINE HCL 25 MG PO CAPS: 25.0000 mg | ORAL_CAPSULE | Freq: Four times a day (QID) | ORAL | 0 refills | Status: AC | PRN

## 2023-06-13 NOTE — ED Triage Notes (Signed)
 Patient c/o hives since Saturday she has been treating with benadryl, seen at [facility] and given steroid shot.  Here because her face is swollen.  Patient's face is swollen with visible hives.  Patient denies difficulty breathing/swallowing.

## 2023-06-13 NOTE — ED Triage Notes (Signed)
"  I have been having hives since Saturday, I did have my nails done on Saturday, these are recurrent, benadryl resolved the rash but lingering". "I haven't reached out to my PCP  regarding this".

## 2023-06-13 NOTE — ED Provider Notes (Signed)
 [place] EMERGENCY DEPARTMENT AT [facility] Provider Note   CSN: [id number] Arrival date & time: 06/13/23  1515     History  Chief Complaint  Patient presents with   Allergic Reaction    Nicole Lamb is a 38 y.o. female.  HPI     38 year old patient comes in with chief complaint of hives.  Patient indicates that on Saturday, she started noticing hives in the afternoon after she returned from nail salon.  She had been to the nail salon for at least 10 years without any issues.  She took a shower, Benadryl and her symptoms resolved.  The next day, while at church she started having itching and rash again.  She again went home, took Benadryl, showered and went to sleep.  This morning her symptoms returned, therefore she went to urgent care.  At the urgent care she received a steroid shot and was discharged, but within minutes of being discharged she started having hives and itching again.  Patient denies any shortness of breath, throat swelling, tightness to her throat, abdominal pain, nausea, vomiting.  She is having mild chest tightness now along with itching and rash all over her body.  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  Home Medications Prior to Admission medications   Medication Sig Start Date End Date Taking? Authorizing Provider  diphenhydrAMINE (BENADRYL) 25 mg capsule Take 1 capsule (25 mg total) by mouth every 6 (six) hours as needed for itching. 06/13/23  Yes Milliana Reddoch, MD  EPINEPHrine 0.3 mg/0.3 mL IJ SOAJ injection Inject 0.3 mg into the muscle as needed for anaphylaxis. 06/13/23  Yes Marley Charlot, MD  predniSONE (STERAPRED UNI-PAK 21 TAB) 10 MG (21) TBPK tablet Take by mouth daily. Take 6 tabs by mouth daily  for 2 days, then 5 tabs for 2 days, then 4 tabs for 2 days, then 3 tabs for 2 days, 2 tabs for 2 days, then 1 tab by mouth daily for 2 days 06/13/23  Yes Derwood Kaplan, MD  acetaminophen (TYLENOL) 500 MG tablet Take 1,000 mg  by mouth every 6 (six) hours as needed for mild pain, moderate pain or headache.    [provider]  azithromycin (ZITHROMAX) 250 MG tablet Take 250 mg by mouth as directed.    [provider]  chlorhexidine (PERIDEX) 0.12 % solution Use as directed 5 mLs in the mouth or throat as directed.    [provider]  cyclobenzaprine (FLEXERIL) 10 MG tablet Take 10 mg by mouth as directed.    [provider]  fexofenadine (ALLEGRA) 180 MG tablet Take 1 tablet by mouth daily.    [provider]  fluocinonide cream (LIDEX) 0.05 % Apply 1 Application topically as directed.    [provider]  fluticasone (FLONASE) 50 MCG/ACT nasal spray Place 1 spray into both nostrils daily.    [provider]  HYDROcodone-acetaminophen (NORCO/VICODIN) 5-325 MG tablet Take 1-2 tablets by mouth every 4 (four) hours as needed.    [provider]  medroxyPROGESTERone (DEPO-PROVERA) 150 MG/ML injection Inject 150 mg into the muscle every 3 (three) months.    [provider]  medroxyPROGESTERone Acetate 150 MG/ML SUSY ADMINISTER 1 ML IN THE MUSCLE EVERY 3 MONTHS    [provider]  meloxicam (MOBIC) 7.5 MG tablet Take 7.5 mg by mouth daily as needed. 12/31/19   [provider]  Multiple Vitamins-Minerals (MULTIPLE VITAMINS/WOMENS PO) Take by mouth.    [provider]  psyllium (METAMUCIL) 58.6 % packet Take 1 packet by mouth daily.    [provider]  dicyclomine (BENTYL) 20 MG tablet Take 1 tablet (20 mg total) by mouth 2 (two) times daily. 11/25/19 12/09/19  Mare Ferrari, PA-C      Allergies    Bee venom, Shellfish allergy, Shellfish-derived products, and Povidone-iodine    Review of Systems   Review of Systems  All other systems reviewed and are negative.   Physical Exam Updated Vital Signs BP 127/79   Pulse 82   Temp 98.3 F (36.8 C)   Resp 17   Wt 90.7 kg   LMP  (LMP Unknown)   SpO2 97%   BMI  33.28 kg/m  Physical Exam Vitals and nursing note reviewed.  Constitutional:      Appearance: She is well-developed.  HENT:     Head: Normocephalic and atraumatic.     Mouth/Throat:     Mouth: Mucous membranes are moist.  Eyes:     Extraocular Movements: Extraocular movements intact.  Cardiovascular:     Rate and Rhythm: Normal rate.  Pulmonary:     Effort: Pulmonary effort is normal.     Breath sounds: No stridor. No wheezing.  Musculoskeletal:     Cervical back: Normal range of motion and neck supple.  Skin:    General: Skin is dry.     Findings: Erythema and rash present.     Comments: Diffuse hives noted covering the upper part of her chest, upper extremity, back  Neurological:     Mental Status: She is alert and oriented to person, place, and time.     ED Results / Procedures / Treatments   Labs (all labs ordered are listed, but only abnormal results are displayed) Labs Reviewed - No data to display  EKG None  Radiology No results found.  Procedures Procedures    Medications Ordered in ED Medications  diphenhydrAMINE (BENADRYL) capsule 50 mg (has no administration in time range)    ED Course/ Medical Decision Making/ A&P                                 Medical Decision Making Risk Prescription drug management.   38 year old patient comes in with chief complaint of allergic reaction.  She is noted to have hives diffusely, and her symptoms are consistent with hypersensitivity reaction.  Differential essentially includes allergic reaction, mast cell activation, paraneoplastic process.  Patient currently does not appear to be anaphylactic.  We will prescribe her prednisone taper along with Benadryl every 6 hours.  Allergy clinic information provided.  Return precautions discussed.  We will also give her EpiPen, in case her symptoms get severe.  Return precautions to the ER discussed with the patient.  Final Clinical Impression(s) / ED  Diagnoses Final diagnoses:  Hives    Rx / DC Orders ED Discharge Orders          Ordered    predniSONE (STERAPRED UNI-PAK 21 TAB) 10 MG (21) TBPK tablet  Daily        06/13/23 1621    diphenhydrAMINE (BENADRYL) 25 mg capsule  Every 6 hours PRN        06/13/23 1621    EPINEPHrine 0.3 mg/0.3 mL IJ SOAJ injection  As needed        06/13/23 1621              Derwood Kaplan, MD 06/13/23 1628

## 2023-06-13 NOTE — ED Provider Notes (Signed)
 [facility]    CSN: [id number] Arrival date & time: 06/13/23  1213      History   Chief Complaint Chief Complaint  Patient presents with   Rash    HPI Nicole Lamb is a 38 y.o. female.   Patient here today for evaluation of rash that started few days ago.  She reports itching hive-like lesions to bilateral arms, chest, abdomen and legs.  Symptoms started after she had gotten her nails done.  She is unsure of trigger.  She denies any shortness of breath or trouble swallowing.  She notes she has taken Benadryl and rash are resolved but then returned.  The history is provided by the patient.    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      Home Medications    Prior to Admission medications   Medication Sig Start Date End Date Taking? Authorizing Provider  diphenhydrAMINE (BENADRYL) 25 mg capsule Take 25 mg by mouth every 6 (six) hours as needed.   Yes [provider]  Multiple Vitamins-Minerals (MULTIPLE VITAMINS/WOMENS PO) Take by mouth.   Yes [provider]  acetaminophen (TYLENOL) 500 MG tablet Take 1,000 mg by mouth every 6 (six) hours as needed for mild pain, moderate pain or headache.    [provider]  azithromycin (ZITHROMAX) 250 MG tablet Take 250 mg by mouth as directed.    [provider]  chlorhexidine (PERIDEX) 0.12 % solution Use as directed 5 mLs in the mouth or throat as directed.    [provider]  cyclobenzaprine (FLEXERIL) 10 MG tablet Take 10 mg by mouth as directed.    [provider]  fexofenadine (ALLEGRA) 180 MG tablet Take 1 tablet  by mouth daily.    [provider]  fluocinonide cream (LIDEX) 0.05 % Apply 1 Application topically as directed.    [provider]  fluticasone (FLONASE) 50 MCG/ACT nasal spray Place 1 spray into both nostrils daily.    [provider]  HYDROcodone-acetaminophen (NORCO/VICODIN) 5-325 MG tablet Take 1-2 tablets by mouth every 4 (four) hours as needed.    [provider]  medroxyPROGESTERone (DEPO-PROVERA) 150 MG/ML injection Inject 150 mg into the muscle every 3 (three) months.    [provider]  medroxyPROGESTERone Acetate 150 MG/ML SUSY ADMINISTER 1 ML IN THE MUSCLE EVERY 3 MONTHS    [provider]  meloxicam (MOBIC) 7.5 MG tablet Take 7.5 mg by mouth daily as needed. 12/31/19   [provider]  psyllium (METAMUCIL) 58.6 % packet Take 1 packet by mouth daily.    [provider]  dicyclomine (BENTYL) 20 MG tablet Take 1 tablet (20 mg total) by mouth 2 (two) times daily. 11/25/19 12/09/19  Mare Ferrari, PA-C    Family History Family History  Problem Relation Age of Onset   Diabetes Mother    Hypertension Father    Colon polyps Paternal Grandmother    Colon cancer Neg Hx    Esophageal cancer Neg Hx    Stomach cancer Neg Hx    Rectal cancer Neg Hx  Social History Social History   Tobacco Use   Smoking status: Never   Smokeless tobacco: Never  Vaping Use   Vaping status: Never Used  Substance Use Topics   Alcohol use: Yes    Alcohol/week: 7.0 standard drinks of alcohol    Types: 7 Glasses of wine per week    Comment: 1 glass of wine per night   Drug use: No     Allergies   Bee venom, Shellfish allergy, Shellfish-derived products, and Povidone-iodine   Review of Systems Review of Systems  Constitutional:  Negative for chills and fever.  Eyes:  Negative for discharge and redness.  Gastrointestinal:  Negative for abdominal pain, nausea and vomiting.  Skin:  Positive for rash. Negative for color  change and wound.     Physical Exam Triage Vital Signs ED Triage Vitals  Encounter Vitals Group     BP      Systolic BP Percentile      Diastolic BP Percentile      Pulse      Resp      Temp      Temp src      SpO2      Weight      Height      Head Circumference      Peak Flow      Pain Score      Pain Loc      Pain Education      Exclude from Growth Chart    No data found.  Updated Vital Signs BP 96/62 (BP Location: Left Arm)   Pulse 77   Temp 98.2 F (36.8 C) (Oral)   Resp 18   LMP  (LMP Unknown)   SpO2 97%   Visual Acuity Right Eye Distance:   Left Eye Distance:   Bilateral Distance:    Right Eye Near:   Left Eye Near:    Bilateral Near:     Physical Exam Vitals and nursing note reviewed.  Constitutional:      General: She is not in acute distress.    Appearance: Normal appearance. She is not ill-appearing.  HENT:     Head: Normocephalic and atraumatic.  Eyes:     Conjunctiva/sclera: Conjunctivae normal.  Cardiovascular:     Rate and Rhythm: Normal rate.  Pulmonary:     Effort: Pulmonary effort is normal. No respiratory distress.  Skin:    Comments: Scattered mildly erythematous wheal-like lesions noted to bilateral arms, upper abdomen and legs  Neurological:     Mental Status: She is alert.  Psychiatric:        Mood and Affect: Mood normal.        Behavior: Behavior normal.        Thought Content: Thought content normal.      UC Treatments / Results  Labs (all labs ordered are listed, but only abnormal results are displayed) Labs Reviewed - No data to display  EKG   Radiology No results found.  Procedures Procedures (including critical care time)  Medications Ordered in UC Medications  methylPREDNISolone acetate (DEPO-MEDROL) injection 80 mg (80 mg Intramuscular Given 06/13/23 1330)    Initial Impression / Assessment and Plan / UC Course  I have reviewed the triage vital signs and the nursing notes.  Pertinent labs &  imaging results that were available during my care of the patient were reviewed by me and considered in my medical decision making (see chart for details).    Injection administered in office  and discussed that she can continue Benadryl if needed.  Recommended follow-up if no gradual improvement or with any further concerns.  Final Clinical Impressions(s) / UC Diagnoses   Final diagnoses:  Hives   Discharge Instructions   None    ED Prescriptions   None    PDMP not reviewed this encounter.   Tomi Bamberger, PA-C 06/13/23 1347

## 2023-06-13 NOTE — Discharge Instructions (Addendum)
 We saw you in the ER after you had the allergic reaction.  The reaction is severe, however, it appears to be in control. We are not sure what caused the reaction, and it is important for you to follow up with an allergist. Please take the medications prescribed. PLEASE RETURN TO THE ER IMMEDIATELY IN CASE YOU START HAVING WORSENING SWELLING, DIFFICULTY IN BREATHING ETC.
